# Patient Record
Sex: Female | Born: 1945
Health system: Southern US, Community
[De-identification: ages and names within clinical notes are randomized; demographics above are authoritative.]

## PROBLEM LIST (undated history)

## (undated) DIAGNOSIS — F32A Depression, unspecified: Secondary | ICD-10-CM

## (undated) DIAGNOSIS — R51 Headache: Secondary | ICD-10-CM

## (undated) DIAGNOSIS — F329 Major depressive disorder, single episode, unspecified: Secondary | ICD-10-CM

## (undated) DIAGNOSIS — K635 Polyp of colon: Secondary | ICD-10-CM

## (undated) DIAGNOSIS — R519 Headache, unspecified: Secondary | ICD-10-CM

## (undated) DIAGNOSIS — E119 Type 2 diabetes mellitus without complications: Secondary | ICD-10-CM

## (undated) DIAGNOSIS — I10 Essential (primary) hypertension: Secondary | ICD-10-CM

## (undated) DIAGNOSIS — E785 Hyperlipidemia, unspecified: Secondary | ICD-10-CM

## (undated) DIAGNOSIS — Z8619 Personal history of other infectious and parasitic diseases: Secondary | ICD-10-CM

## (undated) HISTORY — DX: Depression, unspecified: F32.A

## (undated) HISTORY — PX: TONSILLECTOMY: SHX5217

## (undated) HISTORY — DX: Hyperlipidemia, unspecified: E78.5

## (undated) HISTORY — DX: Polyp of colon: K63.5

## (undated) HISTORY — DX: Headache: R51

## (undated) HISTORY — DX: Type 2 diabetes mellitus without complications: E11.9

## (undated) HISTORY — DX: Headache, unspecified: R51.9

## (undated) HISTORY — DX: Personal history of other infectious and parasitic diseases: Z86.19

## (undated) HISTORY — DX: Essential (primary) hypertension: I10

## (undated) HISTORY — DX: Major depressive disorder, single episode, unspecified: F32.9

---

## 1974-02-19 HISTORY — PX: TUBAL LIGATION: SHX77

## 2012-10-02 LAB — HM COLONOSCOPY

## 2015-09-13 LAB — HM DEXA SCAN

## 2015-09-14 LAB — HM MAMMOGRAPHY

## 2016-01-20 ENCOUNTER — Ambulatory Visit: Payer: Self-pay | Admitting: Family

## 2016-02-03 ENCOUNTER — Telehealth: Payer: Self-pay | Admitting: Behavioral Health

## 2016-02-03 NOTE — Telephone Encounter (Signed)
Unable to reach patient at time of Pre-Visit Call.  Left message for patient to return call when available.    

## 2016-02-06 ENCOUNTER — Ambulatory Visit (INDEPENDENT_AMBULATORY_CARE_PROVIDER_SITE_OTHER): Payer: Medicare Other | Admitting: Family

## 2016-02-06 ENCOUNTER — Encounter: Payer: Self-pay | Admitting: Family

## 2016-02-06 VITALS — BP 161/85 | HR 85 | Temp 98.3°F | Resp 18 | Ht 67.0 in | Wt 160.6 lb

## 2016-02-06 DIAGNOSIS — E118 Type 2 diabetes mellitus with unspecified complications: Secondary | ICD-10-CM | POA: Diagnosis not present

## 2016-02-06 DIAGNOSIS — F418 Other specified anxiety disorders: Secondary | ICD-10-CM

## 2016-02-06 DIAGNOSIS — IMO0001 Reserved for inherently not codable concepts without codable children: Secondary | ICD-10-CM

## 2016-02-06 DIAGNOSIS — E1165 Type 2 diabetes mellitus with hyperglycemia: Secondary | ICD-10-CM | POA: Diagnosis not present

## 2016-02-06 DIAGNOSIS — I10 Essential (primary) hypertension: Secondary | ICD-10-CM

## 2016-02-06 DIAGNOSIS — E785 Hyperlipidemia, unspecified: Secondary | ICD-10-CM | POA: Diagnosis not present

## 2016-02-06 DIAGNOSIS — E113291 Type 2 diabetes mellitus with mild nonproliferative diabetic retinopathy without macular edema, right eye: Secondary | ICD-10-CM | POA: Insufficient documentation

## 2016-02-06 DIAGNOSIS — E119 Type 2 diabetes mellitus without complications: Secondary | ICD-10-CM | POA: Insufficient documentation

## 2016-02-06 HISTORY — DX: Essential (primary) hypertension: I10

## 2016-02-06 HISTORY — DX: Type 2 diabetes mellitus with mild nonproliferative diabetic retinopathy without macular edema, right eye: E11.3291

## 2016-02-06 HISTORY — DX: Other specified anxiety disorders: F41.8

## 2016-02-06 LAB — LIPID PANEL
CHOL/HDL RATIO: 4
Cholesterol: 214 mg/dL — ABNORMAL HIGH (ref 0–200)
HDL: 54.2 mg/dL (ref >39.00)
LDL Cholesterol: 125 mg/dL — ABNORMAL HIGH (ref 0–99)
NONHDL: 159.42
Triglycerides: 172 mg/dL — ABNORMAL HIGH (ref 0.0–149.0)
VLDL: 34.4 mg/dL (ref 0.0–40.0)

## 2016-02-06 LAB — MICROALBUMIN / CREATININE URINE RATIO
Creatinine,U: 71.7 mg/dL
MICROALB/CREAT RATIO: 1 mg/g (ref 0.0–30.0)
Microalb, Ur: <0.7 mg/dL (ref 0.0–1.9)

## 2016-02-06 LAB — BASIC METABOLIC PANEL
BUN: 20 mg/dL (ref 6–23)
CO2: 25 mEq/L (ref 19–32)
CREATININE: 0.75 mg/dL (ref 0.40–1.20)
Calcium: 9.8 mg/dL (ref 8.4–10.5)
Chloride: 103 mEq/L (ref 96–112)
GFR: 81.4 mL/min (ref >60.00)
Glucose, Bld: 261 mg/dL — ABNORMAL HIGH (ref 70–99)
Potassium: 4.3 mEq/L (ref 3.5–5.1)
Sodium: 137 mEq/L (ref 135–145)

## 2016-02-06 LAB — HEMOGLOBIN A1C: HEMOGLOBIN A1C: 12.4 % — AB (ref 4.6–6.5)

## 2016-02-06 MED ORDER — VENLAFAXINE HCL ER 37.5 MG PO CP24: ORAL_CAPSULE | ORAL | 0 refills | Status: DC

## 2016-02-06 NOTE — Progress Notes (Signed)
Subjective:    Patient ID: Wendy Guzman, female    DOB: Mar 31, 1945, 70 y.o.   MRN: [id number]  HPI  [name] is a 70 yr old female who presents today to establish care.  1) HTN- Pt is maintained on losartan BP Readings from Last 3 Encounters:  02/06/16 (!) 161/85   2) Hyperlipidemia- has hx of myalgia on statin.  She is maintained on zetia. Tries to watch her diet.   3) Depression/anxiety- maintained on prn xanax and daily citalopram. She reports that her mood is improved since Dr.  Leonette MostKalish increased her citalopram.  Reports prn use of xanax.    4) DM2- maintained on glipizide. She does not check her sugars at home.  She will meet with an endocrinologist.  She will establish with Dr. Allena KatzPatel.    Review of Systems  Constitutional: Negative for unexpected weight change.  HENT: Negative for hearing loss.        Reports recent URI with residual cough  Eyes: Negative for visual disturbance.  Respiratory: Negative for shortness of breath.   Cardiovascular: Negative for leg swelling.  Gastrointestinal: Negative for constipation and diarrhea.  Genitourinary: Negative for dysuria and frequency.  Musculoskeletal: Negative for arthralgias and myalgias.  Skin: Negative for rash.  Neurological:       Occasional headaches  Hematological: Negative for adenopathy.  Psychiatric/Behavioral:       See hpi      Past Medical History:  Diagnosis Date  . Colon polyps   . Depression   . Diabetes mellitus without complication (HCC)   . Frequent headaches   . History of chicken pox   . Hyperlipidemia   . Hypertension      Social History   Social History  . Marital status: Married    Spouse name: N/A  . Number of children: N/A  . Years of education: N/A   Occupational History  . Not on file.   Social History Main Topics  . Smoking status: Never Smoker  . Smokeless tobacco: Never Used  . Alcohol use 0.6 - 1.2 oz/week    1 - 2 Glasses of wine per week  . Drug use: No  .  Sexual activity: Not on file   Other Topics Concern  . Not on file   Social History Narrative  . No narrative on file    Past Surgical History:  Procedure Laterality Date  . TONSILLECTOMY  1950s  . TUBAL LIGATION  1976    Family History  Problem Relation Age of Onset  . COPD Mother   . Aneurysm Mother   . Cancer Father     lung  . Diabetes Father   . Diabetes Paternal Uncle   . Hypertension Maternal Grandmother     Allergies  Allergen Reactions  . Ezetimibe-Simvastatin Other (See Comments)    Extreme muscle pain and weakness  . Lovastatin Other (See Comments)    Extreme muscle pain and weakness  . Rosuvastatin Other (See Comments)    Extreme muscle pain and weakness    No current outpatient prescriptions on file prior to visit.   No current facility-administered medications on file prior to visit.     BP (!) 161/85 (BP Location: Right Arm, Cuff Size: Normal)   Pulse 85   Temp 98.3 F (36.8 C) (Oral)   Resp 18   Ht 5\' 7"  (1.702 m)   Wt 160 lb 9.6 oz (72.8 kg)   SpO2 98% Comment: room air  BMI 25.15 kg/m  Objective:   Physical Exam  Constitutional: She is oriented to person, place, and time. She appears well-developed and well-nourished.  HENT:  Head: Normocephalic and atraumatic.  Right Ear: Tympanic membrane and ear canal normal.  Left Ear: Tympanic membrane and ear canal normal.  Mouth/Throat: No oropharyngeal exudate, posterior oropharyngeal edema or posterior oropharyngeal erythema.  Cardiovascular: Normal rate, regular rhythm and normal heart sounds.   No murmur heard. Pulmonary/Chest: Effort normal and breath sounds normal. No respiratory distress. She has no wheezes.  Musculoskeletal: She exhibits no edema.  Neurological: She is alert and oriented to person, place, and time.  Psychiatric: Her behavior is normal. Judgment and thought content normal.  Tearful upon discussion of family stress          Assessment & Plan:

## 2016-02-06 NOTE — Assessment & Plan Note (Signed)
Depression is uncontrolled. Will add effexor.

## 2016-02-06 NOTE — Assessment & Plan Note (Signed)
Uncontrolled per patient.  She is advised to follow through with her endo appointment.  Obtain A1C, urine microalbumin.

## 2016-02-06 NOTE — Assessment & Plan Note (Signed)
Tolerating zetia, obtain lipid panel.

## 2016-02-06 NOTE — Patient Instructions (Signed)
Please complete lab work prior to leaving. Begin effexor 1 tab once daily for 3 days, then increase to 2 tabs once daily. Check blood pressure daily.  Call if BP >150/90 at home.

## 2016-02-06 NOTE — Assessment & Plan Note (Signed)
Notes that she has not taken her AM medication. Advised to take her med when she gets home today, check bp daily. Call if home bp >150/90.

## 2016-02-06 NOTE — Progress Notes (Signed)
Pre visit review using our clinic review tool, if applicable. No additional management support is needed unless otherwise documented below in the visit note. 

## 2016-02-10 ENCOUNTER — Telehealth: Payer: Self-pay | Admitting: Family

## 2016-02-10 MED ORDER — ASPIRIN EC 81 MG PO TBEC: 81.0000 mg | DELAYED_RELEASE_TABLET | Freq: Every day | ORAL | Status: DC

## 2016-02-10 NOTE — Telephone Encounter (Signed)
Sugar is very uncontrolled. Pt should keep her upcoming appointment with endocrinology.  Cholesterol is above goal. Needs to work hard on low fat/low cholesterol diet. Add aspirin 81mg  once daily for cardiac prevention.

## 2016-02-10 NOTE — Telephone Encounter (Signed)
Left message on machine to call back  

## 2016-02-15 NOTE — Telephone Encounter (Signed)
Left message on machine to call back on home and cell 

## 2016-02-17 NOTE — Telephone Encounter (Signed)
Spoke to the patient informed of results/instructions. She did have to reschedule her endo appt. Due to a family emergency, but has rescheduled and does understand the importance of being seen by Endo.  The patient did verbally understand/agreed to all result instructions.

## 2016-03-04 ENCOUNTER — Other Ambulatory Visit: Payer: Self-pay | Admitting: Family

## 2016-03-09 ENCOUNTER — Ambulatory Visit: Payer: Medicare Other | Admitting: Family

## 2016-03-14 ENCOUNTER — Encounter: Payer: Self-pay | Admitting: Family

## 2016-03-14 ENCOUNTER — Ambulatory Visit (INDEPENDENT_AMBULATORY_CARE_PROVIDER_SITE_OTHER): Payer: Medicare Other | Admitting: Family

## 2016-03-14 DIAGNOSIS — E118 Type 2 diabetes mellitus with unspecified complications: Secondary | ICD-10-CM

## 2016-03-14 DIAGNOSIS — F418 Other specified anxiety disorders: Secondary | ICD-10-CM

## 2016-03-14 DIAGNOSIS — I10 Essential (primary) hypertension: Secondary | ICD-10-CM | POA: Diagnosis not present

## 2016-03-14 NOTE — Progress Notes (Signed)
Pre visit review using our clinic review tool, if applicable. No additional management support is needed unless otherwise documented below in the visit note. 

## 2016-03-14 NOTE — Progress Notes (Signed)
Subjective:    Patient ID: Wendy Guzman, female    DOB: 1945/06/16, 71 y.o.   MRN: [id number]  HPI  Wendy Guzman is a 71 yr old female who presents today for follow up.  Depression. Last visit she noted that her depression was not well controlled and we added effexor to her regimen.  She reports that since we added effexor she is "more even keel."  Her grandchildren are living with her.  "Life is a little easier."  She is looking into establishing with a christian based counselor.   HTN- last visit she noted that she had not takedn her BP meds.   BP Readings from Last 3 Encounters:  03/14/16 110/70  02/06/16 (!) 161/85   DM2- she is scheduled to see Dr. Allena KatzPatel next week for endocrinology.  Lab Results  Component Value Date   HGBA1C 12.4 (H) 02/06/2016    Review of Systems    see HPI  Past Medical History:  Diagnosis Date  . Colon polyps   . Depression   . Diabetes mellitus without complication (HCC)   . Frequent headaches   . History of chicken pox   . Hyperlipidemia   . Hypertension      Social History   Social History  . Marital status: Married    Spouse name: N/A  . Number of children: N/A  . Years of education: N/A   Occupational History  . Not on file.   Social History Main Topics  . Smoking status: Never Smoker  . Smokeless tobacco: Never Used  . Alcohol use 0.6 - 1.2 oz/week    1 - 2 Glasses of wine per week  . Drug use: No  . Sexual activity: Not on file   Other Topics Concern  . Not on file   Social History Narrative   Retired Runner, broadcasting/film/videoteacher and ten worked at [employer]   Married   3 children, all are local ([place])   Enjoys reading, Clinical cytogeneticistcrafting, quilting/crocheting    Past Surgical History:  Procedure Laterality Date  . TONSILLECTOMY  1950s  . TUBAL LIGATION  1976    Family History  Problem Relation Age of Onset  . COPD Mother   . Aneurysm Mother   . Cancer Father     lung  . Diabetes Father   . Diabetes Paternal Uncle     . Hypertension Maternal Grandmother     Allergies  Allergen Reactions  . Ezetimibe-Simvastatin Other (See Comments)    Extreme muscle pain and weakness  . Lovastatin Other (See Comments)    Extreme muscle pain and weakness  . Rosuvastatin Other (See Comments)    Extreme muscle pain and weakness    Current Outpatient Prescriptions on File Prior to Visit  Medication Sig Dispense Refill  . ALPRAZolam (XANAX) 0.5 MG tablet Take 0.5 mg by mouth 3 (three) times daily as needed.    Marland Kitchen. aspirin EC 81 MG tablet Take 1 tablet (81 mg total) by mouth daily.    . citalopram (CELEXA) 40 MG tablet Take 40 mg by mouth daily.    Marland Kitchen. ezetimibe (ZETIA) 10 MG tablet Take 1 tablet by mouth daily.    . fluticasone (FLONASE) 50 MCG/ACT nasal spray Place 2 sprays into both nostrils daily.    Marland Kitchen. GLIPIZIDE PO Take 1 tablet by mouth daily.    Marland Kitchen. losartan (COZAAR) 50 MG tablet Take 1 tablet by mouth daily.    Marland Kitchen. venlafaxine XR (EFFEXOR-XR) 37.5 MG 24 hr capsule Take 2  tablets once a day. 60 capsule 0   No current facility-administered medications on file prior to visit.     BP 110/70 (BP Location: Left Arm, Cuff Size: Normal)   Pulse 83   Temp 97.9 F (36.6 C) (Oral)   Resp 16   Ht 5\' 7"  (1.702 m)   Wt 159 lb 3.2 oz (72.2 kg)   SpO2 100% Comment: room air  BMI 24.93 kg/m    Objective:   Physical Exam  Constitutional: She is oriented to person, place, and time. She appears well-developed and well-nourished.  HENT:  Head: Normocephalic and atraumatic.  Cardiovascular: Normal rate, regular rhythm and normal heart sounds.   No murmur heard. Pulmonary/Chest: Effort normal and breath sounds normal. No respiratory distress. She has no wheezes.  Musculoskeletal: She exhibits no edema.  Neurological: She is alert and oriented to person, place, and time.  Skin: Skin is warm and dry.  Psychiatric: She has a normal mood and affect. Her behavior is normal. Judgment and thought content normal.           Assessment & Plan:

## 2016-03-14 NOTE — Patient Instructions (Addendum)
Please continue effexor.  Consider scheduling an appointment with a counselor of your choice.

## 2016-03-14 NOTE — Assessment & Plan Note (Signed)
Uncontrolled. Reinforced importance of following through with her upcoming endocrinology consult.

## 2016-03-14 NOTE — Assessment & Plan Note (Signed)
Improved since effexor was added to her citalopram regimen. Continue same.

## 2016-03-14 NOTE — Assessment & Plan Note (Signed)
Improved this visit back on her meds. Reinforced importance of compliance and taking good care of herself.

## 2016-03-18 ENCOUNTER — Other Ambulatory Visit: Payer: Self-pay | Admitting: Family

## 2016-04-13 LAB — HM DIABETES EYE EXAM

## 2016-06-11 ENCOUNTER — Ambulatory Visit (INDEPENDENT_AMBULATORY_CARE_PROVIDER_SITE_OTHER): Payer: Medicare Other | Admitting: Family

## 2016-06-11 ENCOUNTER — Encounter: Payer: Self-pay | Admitting: Family

## 2016-06-11 VITALS — BP 123/76 | HR 80 | Temp 98.3°F | Resp 18 | Ht 67.0 in | Wt 169.0 lb

## 2016-06-11 DIAGNOSIS — E785 Hyperlipidemia, unspecified: Secondary | ICD-10-CM

## 2016-06-11 DIAGNOSIS — E119 Type 2 diabetes mellitus without complications: Secondary | ICD-10-CM

## 2016-06-11 DIAGNOSIS — I10 Essential (primary) hypertension: Secondary | ICD-10-CM

## 2016-06-11 DIAGNOSIS — Z1239 Encounter for other screening for malignant neoplasm of breast: Secondary | ICD-10-CM

## 2016-06-11 DIAGNOSIS — Z0000 Encounter for general adult medical examination without abnormal findings: Secondary | ICD-10-CM | POA: Diagnosis not present

## 2016-06-11 LAB — BASIC METABOLIC PANEL
BUN: 31 mg/dL — AB (ref 6–23)
CO2: 26 mEq/L (ref 19–32)
Calcium: 9.8 mg/dL (ref 8.4–10.5)
Chloride: 106 mEq/L (ref 96–112)
Creatinine, Ser: 0.85 mg/dL (ref 0.40–1.20)
GFR: 70.38 mL/min (ref >60.00)
Glucose, Bld: 180 mg/dL — ABNORMAL HIGH (ref 70–99)
POTASSIUM: 4.3 meq/L (ref 3.5–5.1)
SODIUM: 137 meq/L (ref 135–145)

## 2016-06-11 LAB — LIPID PANEL
Cholesterol: 221 mg/dL — ABNORMAL HIGH (ref 0–200)
HDL: 64.6 mg/dL (ref >39.00)
LDL Cholesterol: 145 mg/dL — ABNORMAL HIGH (ref 0–99)
NONHDL: 155.93
Total CHOL/HDL Ratio: 3
Triglycerides: 54 mg/dL (ref 0.0–149.0)
VLDL: 10.8 mg/dL (ref 0.0–40.0)

## 2016-06-11 LAB — MICROALBUMIN / CREATININE URINE RATIO
Creatinine,U: 115.9 mg/dL
MICROALB/CREAT RATIO: 1.2 mg/g (ref 0.0–30.0)
Microalb, Ur: 1.4 mg/dL (ref 0.0–1.9)

## 2016-06-11 LAB — HEMOGLOBIN A1C: HEMOGLOBIN A1C: 9.2 % — AB (ref 4.6–6.5)

## 2016-06-11 MED ORDER — ZOSTER VAC RECOMB ADJUVANTED 50 MCG/0.5ML IM SUSR: INTRAMUSCULAR | 1 refills | Status: DC

## 2016-06-11 NOTE — Progress Notes (Signed)
Subjective:    Patient ID: Wendy Guzman, female    DOB: 1945/08/06, 71 y.o.   MRN: [id number]  HPI  Patient presents today for complete physical.  Immunizations: prevnar, pneumovax up to date. Zoster 2012, declines tetanu Diet: reports that she is making good choices, trying to avoid sweets, white bread.  Exercise: housework/yardwork Colonoscopy: 8/14 Dexa: 2017 Pap Smear: N/A Mammogram: 7/17 Vision: 2/17 Wt Readings from Last 3 Encounters:  06/11/16 169 lb (76.7 kg)  03/14/16 159 lb 3.2 oz (72.2 kg)  02/06/16 160 lb 9.6 oz (72.8 kg)    DM2- Maintained on glipizide.  Reports that her she checks sugar intermittently.  Running in hte 200's.  Lab Results  Component Value Date   HGBA1C 12.4 (H) 02/06/2016   Lab Results  Component Value Date   MICROALBUR <0.7 02/06/2016   LDLCALC 125 (H) 02/06/2016   CREATININE 0.75 02/06/2016   HTN- maintained on losartan. BP Readings from Last 3 Encounters:  06/11/16 123/76  03/14/16 110/70  02/06/16 (!) 161/85   Hyperlipidemia- maintained on zetia. Reports debilitating muscle pain on multiple statins in the past.    Review of Systems  Constitutional: Negative for unexpected weight change.  HENT: Negative for rhinorrhea.   Eyes: Negative for visual disturbance.  Respiratory: Negative for cough.   Cardiovascular: Negative for leg swelling.  Gastrointestinal: Negative for constipation and diarrhea.  Genitourinary: Negative for dysuria and frequency.  Musculoskeletal: Negative for arthralgias and myalgias.  Neurological: Negative for headaches.  Hematological: Negative for adenopathy.  Psychiatric/Behavioral:       Reports depression, has had some stress, husband has prostate cancer, has living stress with daughter and her children living with her.    Past Medical History:  Diagnosis Date  . Colon polyps   . Depression   . Diabetes mellitus without complication (HCC)   . Frequent headaches   . History of chicken pox   .  Hyperlipidemia   . Hypertension      Social History   Social History  . Marital status: Married    Spouse name: N/A  . Number of children: N/A  . Years of education: N/A   Occupational History  . Not on file.   Social History Main Topics  . Smoking status: Never Smoker  . Smokeless tobacco: Never Used  . Alcohol use 1.2 - 2.4 oz/week    2 - 4 Glasses of wine per week  . Drug use: No  . Sexual activity: Not on file   Other Topics Concern  . Not on file   Social History Narrative   Retired Runner, broadcasting/film/video and ten worked at [employer]   Married   3 children, all are local ([place])   Enjoys reading, Clinical cytogeneticist, quilting/crocheting    Past Surgical History:  Procedure Laterality Date  . TONSILLECTOMY  1950s  . TUBAL LIGATION  1976    Family History  Problem Relation Age of Onset  . COPD Mother   . Aneurysm Mother   . Cancer Father     lung  . Diabetes Father   . Diabetes Paternal Uncle   . Hypertension Maternal Grandmother     Allergies  Allergen Reactions  . Ezetimibe-Simvastatin Other (See Comments)    Extreme muscle pain and weakness  . Lovastatin Other (See Comments)    Extreme muscle pain and weakness  . Rosuvastatin Other (See Comments)    Extreme muscle pain and weakness    Current Outpatient Prescriptions on File Prior to Visit  Medication  Sig Dispense Refill  . ALPRAZolam (XANAX) 0.5 MG tablet Take 0.5 mg by mouth 3 (three) times daily as needed.    Marland Kitchen aspirin EC 81 MG tablet Take 1 tablet (81 mg total) by mouth daily.    . Cholecalciferol (VITAMIN D3) 10000 units TABS Take 3 tablets by mouth daily.    . citalopram (CELEXA) 40 MG tablet Take 40 mg by mouth daily.    Marland Kitchen ezetimibe (ZETIA) 10 MG tablet Take 1 tablet by mouth daily.    . fluticasone (FLONASE) 50 MCG/ACT nasal spray Place 2 sprays into both nostrils daily.    Marland Kitchen losartan (COZAAR) 50 MG tablet Take 1 tablet by mouth daily.    Marland Kitchen venlafaxine XR (EFFEXOR-XR) 37.5 MG 24 hr capsule Take  2 capsules (75 mg total) by mouth daily. 60 capsule 5   No current facility-administered medications on file prior to visit.     BP 123/76 (BP Location: Right Arm, Cuff Size: Normal)   Pulse 80   Temp 98.3 F (36.8 C) (Oral)   Resp 18   Ht  (1.702 m)   Wt 169 lb (76.7 kg)   SpO2 98% Comment: room air  BMI 26.47 kg/m        Objective:   Physical Exam  Physical Exam  Constitutional: She is oriented to person, place, and time. She appears well-developed and well-nourished. No distress.  HENT:  Head: Normocephalic and atraumatic.  Right Ear: Tympanic membrane and ear canal normal.  Left Ear: Tympanic membrane and ear canal normal.  Mouth/Throat: Oropharynx is clear and moist.  Eyes: Pupils are equal, round, and reactive to light. No scleral icterus.  Neck: Normal range of motion. No thyromegaly present.  Cardiovascular: Normal rate and regular rhythm.   No murmur heard. Pulmonary/Chest: Effort normal and breath sounds normal. No respiratory distress. He has no wheezes. She has no rales. She exhibits no tenderness.  Abdominal: Soft. Bowel sounds are normal. She exhibits no distension and no mass. There is no tenderness. There is no rebound and no guarding.  Musculoskeletal: She exhibits no edema.  Lymphadenopathy:    She has no cervical adenopathy.  Neurological: She is alert and oriented to person, place, and time. She has normal patellar reflexes. She exhibits normal muscle tone. Coordination normal.  Skin: Skin is warm and dry.  Psychiatric: She has a normal mood and affect. Her behavior is normal. Judgment and thought content normal.  Breasts: Examined lying Right: Without masses, retractions, discharge or axillary adenopathy.  Left: Without masses, retractions, discharge or axillary adenopathy.  Pelvic: deferred.            Assessment & Plan:         Assessment & Plan:  Preventative Care- discussed diet, exercise, weight loss. Refer for mammogram. Rx  sent to [facility] for shingrix.    HTN- BP stable on current medications.  BP Readings from Last 3 Encounters:  06/11/16 123/76  03/14/16 110/70  02/06/16 (!) 161/85   Hyperlipidemia- intolerant ot statins. Obtain follow up lipid panel.  DM2- uncontrolled. Discussed eliminating wine which she has recently increased in setting of stress.  She has also had some weight gain and I suspect that this is contributing. She is advised to follow up with Dr. Allena Katz.

## 2016-06-11 NOTE — Patient Instructions (Addendum)
Restart your aspirin  once daily. Complete lab work prior to leaving. Try to add regular exercise such as walking 15 minutes a day. Work your way up to 30 minutes a day.  Keep your follow up with Dr. Allena Katz.  See if you can begin the Shingrix (shingles) vaccine at [facility].

## 2016-06-11 NOTE — Progress Notes (Signed)
Pre visit review using our clinic review tool, if applicable. No additional management support is needed unless otherwise documented below in the visit note. 

## 2016-06-12 ENCOUNTER — Telehealth: Payer: Self-pay | Admitting: *Deleted

## 2016-06-12 ENCOUNTER — Encounter: Payer: Self-pay | Admitting: Family

## 2016-06-12 DIAGNOSIS — M8580 Other specified disorders of bone density and structure, unspecified site: Secondary | ICD-10-CM | POA: Insufficient documentation

## 2016-06-12 NOTE — Telephone Encounter (Signed)
Result is available through care everywhere. Has "low bone mass". Is this osteoporosis?

## 2016-06-12 NOTE — Telephone Encounter (Signed)
-----   Message from Sandford Craze, NP sent at 06/11/2016  7:57 AM EDT ----- Can you please ask [facility] to send [place] a copy of her dexa from 2017? Thanks!

## 2016-09-10 ENCOUNTER — Telehealth: Payer: Self-pay | Admitting: Family

## 2016-09-10 NOTE — Telephone Encounter (Signed)
Relation to pt: self  Call back [phone]   Reason for call:  Patient was [facility] 06/11/16 [facility] physical appointment [facility] OUTPATIENT VISIT 25 MINUTES and she would like to know, please advise

## 2016-09-11 NOTE — Telephone Encounter (Signed)
Good morning,   Can you take a look at this one to make sure we coded correctly. DOS: 06/11/16 she was expecting a CPE   thanks

## 2016-09-17 NOTE — Telephone Encounter (Signed)
Hi Ebony, I double checked with Dawn on this as I said I would.  Documentation supports the additional charge.

## 2016-09-18 NOTE — Telephone Encounter (Signed)
You're welcome.  The patient's uncontrolled dm was addressed.

## 2016-09-18 NOTE — Telephone Encounter (Signed)
Called patient and let her know the bill was for her uncontrolled DM per coding. She is asking that we remove the 99214 from her visit from 06/11/16 because she sees a specialist for her DM and thought the conversation was just an overview of her [facility] and apart of her CPE

## 2016-09-18 NOTE — Telephone Encounter (Signed)
Thanks for getting back to me about this one. Can you tell the specific concern we addressed that attributed to the charge, so I can explain it to the patient? Thanks

## 2016-09-19 NOTE — Telephone Encounter (Signed)
I reviewed her chart and we reviewed her blood pressure, diet related to Diabetes, as well as cholesterol that visit. This was in addition to the standard work for a physical. She was due for follow up on these issues that day. If we did not do the same day as cpx we would have had to bring her back for separate OV. Even if we hadn't talked about DM, we would have had to charge OV for Htn and DM.

## 2016-12-10 ENCOUNTER — Encounter: Payer: Self-pay | Admitting: Family

## 2016-12-10 ENCOUNTER — Ambulatory Visit (INDEPENDENT_AMBULATORY_CARE_PROVIDER_SITE_OTHER): Payer: Medicare Other | Admitting: Family

## 2016-12-10 ENCOUNTER — Telehealth: Payer: Self-pay | Admitting: Family

## 2016-12-10 DIAGNOSIS — I10 Essential (primary) hypertension: Secondary | ICD-10-CM | POA: Diagnosis not present

## 2016-12-10 DIAGNOSIS — E118 Type 2 diabetes mellitus with unspecified complications: Secondary | ICD-10-CM

## 2016-12-10 DIAGNOSIS — F418 Other specified anxiety disorders: Secondary | ICD-10-CM

## 2016-12-10 DIAGNOSIS — Z23 Encounter for immunization: Secondary | ICD-10-CM | POA: Diagnosis not present

## 2016-12-10 DIAGNOSIS — E785 Hyperlipidemia, unspecified: Secondary | ICD-10-CM

## 2016-12-10 DIAGNOSIS — F419 Anxiety disorder, unspecified: Secondary | ICD-10-CM

## 2016-12-10 LAB — BASIC METABOLIC PANEL
BUN: 16 mg/dL (ref 6–23)
CALCIUM: 9.7 mg/dL (ref 8.4–10.5)
CHLORIDE: 102 meq/L (ref 96–112)
CO2: 27 meq/L (ref 19–32)
CREATININE: 0.82 mg/dL (ref 0.40–1.20)
GFR: 73.25 mL/min (ref >60.00)
GLUCOSE: 365 mg/dL — AB (ref 70–99)
Potassium: 4.5 mEq/L (ref 3.5–5.1)
Sodium: 136 mEq/L (ref 135–145)

## 2016-12-10 LAB — LIPID PANEL
CHOL/HDL RATIO: 3
Cholesterol: 227 mg/dL — ABNORMAL HIGH (ref 0–200)
HDL: 66.9 mg/dL (ref >39.00)
LDL CALC: 140 mg/dL — AB (ref 0–99)
NONHDL: 160.13
TRIGLYCERIDES: 103 mg/dL (ref 0.0–149.0)
VLDL: 20.6 mg/dL (ref 0.0–40.0)

## 2016-12-10 MED ORDER — VENLAFAXINE HCL ER 75 MG PO CP24: 75.0000 mg | ORAL_CAPSULE | Freq: Every day | ORAL | 5 refills | Status: DC

## 2016-12-10 MED ORDER — CITALOPRAM HYDROBROMIDE 40 MG PO TABS: 40.0000 mg | ORAL_TABLET | Freq: Every day | ORAL | 5 refills | Status: DC

## 2016-12-10 MED ORDER — ALPRAZOLAM 0.5 MG PO TABS: 0.5000 mg | ORAL_TABLET | Freq: Three times a day (TID) | ORAL | 0 refills | Status: DC | PRN

## 2016-12-10 MED ORDER — EZETIMIBE 10 MG PO TABS: 10.0000 mg | ORAL_TABLET | Freq: Every day | ORAL | 5 refills | Status: DC

## 2016-12-10 NOTE — Assessment & Plan Note (Signed)
Uncontrolled. She continues to work with Dr. Allena KatzPatel.

## 2016-12-10 NOTE — Assessment & Plan Note (Addendum)
Restart effexor at 75mg  once daily. Continue citalopram. Rx provided for prn xanax.  A controlled substance contract will be signed today and a UDS obtained.

## 2016-12-10 NOTE — Progress Notes (Signed)
Subjective:    Patient ID: Wendy Guzman, female    DOB: 1945/08/06, 71 y.o.   MRN: [id number]  HPI  Wendy Guzman is a 71 yr old female who presents today for follow up.  1) HTN- mainatined on losartan, Denies chest pain/sob or swelling.  BP Readings from Last 3 Encounters:  12/10/16 118/70  06/11/16 123/76  03/14/16 110/70   2) Hyperlipidemia- maintained on zetia.   Lab Results  Component Value Date   CHOL 221 (H) 06/11/2016   HDL 64.60 06/11/2016   LDLCALC 145 (H) 06/11/2016   TRIG 54.0 06/11/2016   CHOLHDL 3 06/11/2016   3) Depression/anxiety-  Maintained on citalopram, effexor and prn xanax. Lost her effexor a few weeks back. Feels more aggitated since she came off and "not in control of my emotions."  4) DM2- followed by Dr. Allena Katz. Last A1C was 8.5.    Review of Systems    see HPI  Past Medical History:  Diagnosis Date  . Colon polyps   . Depression   . Diabetes mellitus without complication (HCC)   . Frequent headaches   . History of chicken pox   . Hyperlipidemia   . Hypertension      Social History   Social History  . Marital status: Married    Spouse name: N/A  . Number of children: N/A  . Years of education: N/A   Occupational History  . Not on file.   Social History Main Topics  . Smoking status: Never Smoker  . Smokeless tobacco: Never Used  . Alcohol use 1.2 - 2.4 oz/week    2 - 4 Glasses of wine per week  . Drug use: No  . Sexual activity: Not on file   Other Topics Concern  . Not on file   Social History Narrative   Retired Runner, broadcasting/film/video and ten worked at [employer]   Married   3 children, all are local ([place])   Enjoys reading, Clinical cytogeneticist, quilting/crocheting    Past Surgical History:  Procedure Laterality Date  . TONSILLECTOMY  1950s  . TUBAL LIGATION  1976    Family History  Problem Relation Age of Onset  . COPD Mother   . Aneurysm Mother   . Cancer Father        lung  . Diabetes Father   . Diabetes  Paternal Uncle   . Hypertension Maternal Grandmother     Allergies  Allergen Reactions  . Ezetimibe-Simvastatin Other (See Comments)    Extreme muscle pain and weakness  . Lovastatin Other (See Comments)    Extreme muscle pain and weakness  . Rosuvastatin Other (See Comments)    Extreme muscle pain and weakness    Current Outpatient Prescriptions on File Prior to Visit  Medication Sig Dispense Refill  . aspirin EC 81 MG tablet Take 1 tablet (81 mg total) by mouth daily.    . Calcium Carbonate-Vitamin D (CALTRATE 600+D) 600-400 MG-UNIT tablet Take 1 tablet by mouth 2 (two) times daily.    . Cholecalciferol (VITAMIN D3) 10000 units TABS Take 3 tablets by mouth daily.    . fluticasone (FLONASE) 50 MCG/ACT nasal spray Place 2 sprays into both nostrils daily.    Marland Kitchen glipiZIDE (GLUCOTROL) 5 MG tablet Take 5 mg by mouth 2 (two) times daily before a meal.    . losartan (COZAAR) 50 MG tablet Take 1 tablet by mouth daily.    Marland Kitchen Zoster Vac Recomb Adjuvanted [facility]) injection One dose IM today, second dose in  2-6 months. 0.5 mL 1   No current facility-administered medications on file prior to visit.     BP 118/70   Pulse 98   Temp 98.5 F (36.9 C) (Oral)   Resp 16   Ht 5\' 7"  (1.702 m)   Wt 185 lb (83.9 kg)   SpO2 98%   BMI 28.98 kg/m    Objective:   Physical Exam  Constitutional: She appears well-developed and well-nourished.  Cardiovascular: Normal rate, regular rhythm and normal heart sounds.   No murmur heard. Pulmonary/Chest: Effort normal and breath sounds normal. No respiratory distress. She has no wheezes.  Psychiatric: She has a normal mood and affect. Her behavior is normal. Judgment and thought content normal.          Assessment & Plan:

## 2016-12-10 NOTE — Patient Instructions (Addendum)
Please restart effexor xr 75mg  once daily. Complete lab work prior to leaving.

## 2016-12-10 NOTE — Assessment & Plan Note (Signed)
Tolerating zetia, obtain follow up lipid panel.  

## 2016-12-10 NOTE — Telephone Encounter (Signed)
Please let pt know that her sugar is very uncontrolled. I would advise she arrange follow up with endocrinology.  Sugar was 365.  Also cholesterol remains above goal, please continue to work on low fat/low cholesterol diet, exercise and weight loss.

## 2016-12-10 NOTE — Assessment & Plan Note (Signed)
bp stable on current medication. Continue same/

## 2016-12-11 NOTE — Telephone Encounter (Signed)
Left message on home # to check mychart. Message sent.

## 2016-12-13 LAB — PAIN MGMT, PROFILE 8 W/CONF, U
6 ACETYLMORPHINE: NEGATIVE ng/mL (ref <10)
Alcohol Metabolites: POSITIVE ng/mL — AB (ref <500)
Amphetamines: NEGATIVE ng/mL (ref <500)
Benzodiazepines: NEGATIVE ng/mL (ref <100)
Buprenorphine, Urine: NEGATIVE ng/mL (ref <5)
Cocaine Metabolite: NEGATIVE ng/mL (ref <150)
Creatinine: 103.5 mg/dL
ETHYL GLUCURONIDE (ETG): 1835 ng/mL — AB (ref <500)
Ethyl Sulfate (ETS): 427 ng/mL — ABNORMAL HIGH (ref <100)
MDMA: NEGATIVE ng/mL (ref <500)
Marijuana Metabolite: NEGATIVE ng/mL (ref <20)
OPIATES: NEGATIVE ng/mL (ref <100)
OXYCODONE: NEGATIVE ng/mL (ref <100)
Oxidant: NEGATIVE ug/mL (ref <200)
pH: 5.95 (ref 4.5–9.0)

## 2016-12-14 ENCOUNTER — Encounter: Payer: Self-pay | Admitting: Family

## 2017-06-05 ENCOUNTER — Other Ambulatory Visit: Payer: Self-pay | Admitting: Family

## 2017-07-01 ENCOUNTER — Other Ambulatory Visit: Payer: Self-pay | Admitting: Family

## 2017-07-05 ENCOUNTER — Other Ambulatory Visit: Payer: Self-pay | Admitting: Family

## 2017-07-05 ENCOUNTER — Encounter: Payer: Self-pay | Admitting: Family

## 2017-08-06 ENCOUNTER — Other Ambulatory Visit: Payer: Self-pay | Admitting: Family

## 2017-09-28 ENCOUNTER — Other Ambulatory Visit: Payer: Self-pay | Admitting: Family

## 2017-10-24 ENCOUNTER — Other Ambulatory Visit: Payer: Self-pay | Admitting: Family

## 2017-10-25 MED ORDER — VENLAFAXINE HCL ER 75 MG PO CP24: 75.0000 mg | ORAL_CAPSULE | Freq: Every day | ORAL | 0 refills | Status: DC

## 2017-10-25 MED ORDER — EZETIMIBE 10 MG PO TABS: 10.0000 mg | ORAL_TABLET | Freq: Every day | ORAL | 0 refills | Status: DC

## 2017-10-25 MED ORDER — CITALOPRAM HYDROBROMIDE 40 MG PO TABS: 40.0000 mg | ORAL_TABLET | Freq: Every day | ORAL | 0 refills | Status: DC

## 2017-11-17 ENCOUNTER — Other Ambulatory Visit: Payer: Self-pay | Admitting: Family

## 2017-11-18 ENCOUNTER — Other Ambulatory Visit: Payer: Self-pay | Admitting: Family

## 2017-11-20 ENCOUNTER — Encounter: Payer: Self-pay | Admitting: Family

## 2017-11-20 ENCOUNTER — Ambulatory Visit (INDEPENDENT_AMBULATORY_CARE_PROVIDER_SITE_OTHER): Payer: Medicare Other | Admitting: Family

## 2017-11-20 VITALS — BP 133/68 | HR 90 | Temp 98.0°F | Resp 16 | Ht 66.5 in | Wt 182.2 lb

## 2017-11-20 DIAGNOSIS — E785 Hyperlipidemia, unspecified: Secondary | ICD-10-CM

## 2017-11-20 DIAGNOSIS — Z1239 Encounter for other screening for malignant neoplasm of breast: Secondary | ICD-10-CM

## 2017-11-20 DIAGNOSIS — F419 Anxiety disorder, unspecified: Secondary | ICD-10-CM

## 2017-11-20 DIAGNOSIS — M810 Age-related osteoporosis without current pathological fracture: Secondary | ICD-10-CM

## 2017-11-20 DIAGNOSIS — Z0000 Encounter for general adult medical examination without abnormal findings: Secondary | ICD-10-CM

## 2017-11-20 DIAGNOSIS — L309 Dermatitis, unspecified: Secondary | ICD-10-CM

## 2017-11-20 DIAGNOSIS — E118 Type 2 diabetes mellitus with unspecified complications: Secondary | ICD-10-CM

## 2017-11-20 DIAGNOSIS — Z23 Encounter for immunization: Secondary | ICD-10-CM | POA: Diagnosis not present

## 2017-11-20 LAB — COMPREHENSIVE METABOLIC PANEL
ALBUMIN: 4 g/dL (ref 3.5–5.2)
ALK PHOS: 83 U/L (ref 39–117)
ALT: 16 U/L (ref 0–35)
AST: 15 U/L (ref 0–37)
BILIRUBIN TOTAL: 0.5 mg/dL (ref 0.2–1.2)
BUN: 16 mg/dL (ref 6–23)
CALCIUM: 9.6 mg/dL (ref 8.4–10.5)
CO2: 27 mEq/L (ref 19–32)
Chloride: 103 mEq/L (ref 96–112)
Creatinine, Ser: 0.86 mg/dL (ref 0.40–1.20)
GFR: 69.15 mL/min (ref >60.00)
Glucose, Bld: 251 mg/dL — ABNORMAL HIGH (ref 70–99)
Potassium: 4.3 mEq/L (ref 3.5–5.1)
Sodium: 136 mEq/L (ref 135–145)
TOTAL PROTEIN: 6.3 g/dL (ref 6.0–8.3)

## 2017-11-20 LAB — LIPID PANEL
CHOLESTEROL: 171 mg/dL (ref 0–200)
HDL: 51.4 mg/dL (ref >39.00)
LDL Cholesterol: 91 mg/dL (ref 0–99)
NONHDL: 119.93
Total CHOL/HDL Ratio: 3
Triglycerides: 145 mg/dL (ref 0.0–149.0)
VLDL: 29 mg/dL (ref 0.0–40.0)

## 2017-11-20 MED ORDER — BETAMETHASONE DIPROPIONATE 0.05 % EX CREA: TOPICAL_CREAM | Freq: Two times a day (BID) | CUTANEOUS | 0 refills | Status: AC

## 2017-11-20 MED ORDER — ZOSTER VAC RECOMB ADJUVANTED 50 MCG/0.5ML IM SUSR: INTRAMUSCULAR | 1 refills | Status: DC

## 2017-11-20 NOTE — Progress Notes (Signed)
Subjective:    Patient ID: Wendy Guzman, female    DOB: 1945/07/22, 72 y.o.   MRN: [id number]  HPI  Patient presents today for complete physical.  Immunizations: Flu shot today.  Shingrix due. Pneumonia shots UTD Diet: trying to eat healthier.  Exercise:  Walking, gardening/mowing, housework  Colonoscopy: 2014- polyps Dexa: 2017 Pap Smear: 02/06/16 Mammogram:02/06/16 Dental:  Due.   Vision:  Due Wt Readings from Last 3 Encounters:  11/20/17 182 lb 3.2 oz (82.6 kg)  12/10/16 185 lb (83.9 kg)  06/11/16 169 lb (76.7 kg)   Anxiety- uses xanax infrequently.  Continues citalopram. Reports symptoms stable.  Review of Systems  Constitutional: Negative for unexpected weight change.  HENT: Positive for postnasal drip. Negative for hearing loss.   Eyes: Negative for visual disturbance.  Respiratory: Negative for cough.   Cardiovascular: Negative for leg swelling.  Gastrointestinal: Negative for blood in stool, constipation and diarrhea.  Genitourinary: Negative for dysuria, frequency and hematuria.  Musculoskeletal: Positive for arthralgias (shoulders).  Skin:       Notes skin rash/dry scaly on face  Neurological: Negative for headaches.  Hematological: Negative for adenopathy.  Psychiatric/Behavioral:       Denies depression/anxiety       Past Medical History:  Diagnosis Date  . Colon polyps   . Depression   . Diabetes mellitus without complication (HCC)   . Frequent headaches   . History of chicken pox   . Hyperlipidemia   . Hypertension      Social History   Socioeconomic History  . Marital status: Married    Spouse name: Not on file  . Number of children: Not on file  . Years of education: Not on file  . Highest education level: Not on file  Occupational History  . Not on file  Social Needs  . Financial resource strain: Not on file  . Food insecurity:    Worry: Not on file    Inability: Not on file  . Transportation needs:    Medical: Not on file      Non-medical: Not on file  Tobacco Use  . Smoking status: Never Smoker  . Smokeless tobacco: Never Used  Substance and Sexual Activity  . Alcohol use: Yes    Alcohol/week: 2.0 - 4.0 standard drinks    Types: 2 - 4 Glasses of wine per week  . Drug use: No  . Sexual activity: Not on file  Lifestyle  . Physical activity:    Days per week: Not on file    Minutes per session: Not on file  . Stress: Not on file  Relationships  . Social connections:    Talks on phone: Not on file    Gets together: Not on file    Attends religious service: Not on file    Active member of club or organization: Not on file    Attends meetings of clubs or organizations: Not on file    Relationship status: Not on file  . Intimate partner violence:    Fear of current or ex partner: Not on file    Emotionally abused: Not on file    Physically abused: Not on file    Forced sexual activity: Not on file  Other Topics Concern  . Not on file  Social History Narrative   Retired Runner, broadcasting/film/video and ten worked at [employer]   Married   3 children, all are local ([place])   Enjoys reading, Clinical cytogeneticist, quilting/crocheting    Past Surgical History:  Procedure Laterality Date  . TONSILLECTOMY  1950s  . TUBAL LIGATION  1976    Family History  Problem Relation Age of Onset  . COPD Mother   . Aneurysm Mother   . Cancer Father        lung  . Diabetes Father   . Diabetes Paternal Uncle   . Hypertension Maternal Grandmother     Allergies  Allergen Reactions  . Ezetimibe-Simvastatin Other (See Comments)    Extreme muscle pain and weakness  . Lovastatin Other (See Comments)    Extreme muscle pain and weakness  . Rosuvastatin Other (See Comments)    Extreme muscle pain and weakness    Current Outpatient Medications on File Prior to Visit  Medication Sig Dispense Refill  . ALPRAZolam (XANAX) 0.5 MG tablet Take 1 tablet (0.5 mg total) by mouth 3 (three) times daily as needed. 30 tablet 0  .  aspirin EC 81 MG tablet Take 1 tablet (81 mg total) by mouth daily.    . Calcium Carbonate-Vitamin D (CALTRATE 600+D) 600-400 MG-UNIT tablet Take 1 tablet by mouth 2 (two) times daily.    . Cholecalciferol (VITAMIN D3) 10000 units TABS Take 3 tablets by mouth daily.    . citalopram (CELEXA) 40 MG tablet Take 1 tablet (40 mg total) by mouth daily. 30 tablet 0  . ezetimibe (ZETIA) 10 MG tablet Take 1 tablet (10 mg total) by mouth daily. 30 tablet 0  . fluticasone (FLONASE) 50 MCG/ACT nasal spray Place 2 sprays into both nostrils daily.    Marland Kitchen glipiZIDE (GLUCOTROL) 5 MG tablet TAKE 1&1/2 TABLETS TWICE A DAY BEFORE MEALS    . losartan (COZAAR) 50 MG tablet Take 1 tablet by mouth daily.    Marland Kitchen venlafaxine XR (EFFEXOR-XR) 75 MG 24 hr capsule Take 1 capsule (75 mg total) by mouth daily with breakfast. 30 capsule 0   No current facility-administered medications on file prior to visit.     BP 133/68 (BP Location: Left Arm, Patient Position: Sitting, Cuff Size: Large)   Pulse 90   Temp 98 F (36.7 C) (Oral)   Resp 16   Ht 5' 6.5" (1.689 m)   Wt 182 lb 3.2 oz (82.6 kg)   SpO2 98%   BMI 28.97 kg/m    Objective:   Physical Exam  Physical Exam  Constitutional: She is oriented to person, place, and time. She appears well-developed and well-nourished. No distress.  HENT:  Head: Normocephalic and atraumatic.  Right Ear: Tympanic membrane and ear canal normal.  Left Ear: Tympanic membrane and ear canal normal.  Mouth/Throat: Oropharynx is clear and moist.  Eyes: Pupils are equal, round, and reactive to light. No scleral icterus.  Neck: Normal range of motion. No thyromegaly present.  Cardiovascular: Normal rate and regular rhythm.   No murmur heard. Pulmonary/Chest: Effort normal and breath sounds normal. No respiratory distress. He has no wheezes. She has no rales. She exhibits no tenderness.  Abdominal: Soft. Bowel sounds are normal. She exhibits no distension and no mass. There is no tenderness.  There is no rebound and no guarding.  Musculoskeletal: She exhibits no edema.  Lymphadenopathy:    She has no cervical adenopathy.  Neurological: She is alert and oriented to person, place, and time. She has normal patellar reflexes. She exhibits normal muscle tone. Coordination normal.  Skin: Skin is warm and dry. eczema right pinna. And some dry patches noted on face.  Psychiatric: She has a normal mood and affect. Her behavior is normal.  Judgment and thought content normal.  Breasts: Examined lying Right: Without masses, retractions, discharge or axillary adenopathy.  Left: Without masses, retractions, discharge or axillary adenopathy.  Pelvic: deferred         Assessment & Plan:   Preventative care- refer for labs, rx sent for Shingrix.  Repeat mammo, dexa, colo. Flu shot today.   Eczema- rx with betamethasone, pt advised to avoid eye area.   Anxiety-stable on citalopram with rare use of xanax.  UDS today.  Contract will be updated.       Assessment & Plan:

## 2017-11-20 NOTE — Patient Instructions (Addendum)
Please schedule routine eye exam and dental.   You may apply betamethasone cream twice daily as needed. Avoid exposure to your eye area. Complete lab work prior to leaving. You should be contacted about your referral to GI.

## 2017-11-21 LAB — PAIN MGMT, PROFILE 8 W/CONF, U
6 ACETYLMORPHINE: NEGATIVE ng/mL (ref <10)
AMPHETAMINES: NEGATIVE ng/mL (ref <500)
Alcohol Metabolites: NEGATIVE ng/mL (ref <500)
Benzodiazepines: NEGATIVE ng/mL (ref <100)
Buprenorphine, Urine: NEGATIVE ng/mL (ref <5)
Cocaine Metabolite: NEGATIVE ng/mL (ref <150)
Creatinine: 76.8 mg/dL
MARIJUANA METABOLITE: NEGATIVE ng/mL (ref <20)
MDMA: NEGATIVE ng/mL (ref <500)
OPIATES: NEGATIVE ng/mL (ref <100)
OXIDANT: NEGATIVE ug/mL (ref <200)
OXYCODONE: NEGATIVE ng/mL (ref <100)
pH: 6.16 (ref 4.5–9.0)

## 2017-11-22 ENCOUNTER — Encounter: Payer: Self-pay | Admitting: Family

## 2017-12-11 ENCOUNTER — Other Ambulatory Visit: Payer: Self-pay | Admitting: Family

## 2017-12-12 ENCOUNTER — Other Ambulatory Visit: Payer: Self-pay | Admitting: Family

## 2018-01-14 ENCOUNTER — Ambulatory Visit (HOSPITAL_BASED_OUTPATIENT_CLINIC_OR_DEPARTMENT_OTHER)
Admission: RE | Admit: 2018-01-14 | Discharge: 2018-01-14 | Disposition: A | Discharge status: Home / Self Care | Payer: Medicare Other | Source: Ambulatory Visit | Attending: Family | Admitting: Family

## 2018-01-14 ENCOUNTER — Encounter (HOSPITAL_BASED_OUTPATIENT_CLINIC_OR_DEPARTMENT_OTHER): Payer: Self-pay

## 2018-01-14 ENCOUNTER — Encounter: Payer: Self-pay | Admitting: Family

## 2018-01-14 DIAGNOSIS — Z1239 Encounter for other screening for malignant neoplasm of breast: Secondary | ICD-10-CM | POA: Diagnosis present

## 2018-01-14 DIAGNOSIS — M810 Age-related osteoporosis without current pathological fracture: Secondary | ICD-10-CM | POA: Diagnosis not present

## 2018-01-23 ENCOUNTER — Encounter: Payer: Self-pay | Admitting: Family

## 2018-05-01 LAB — HM DIABETES EYE EXAM

## 2018-05-23 ENCOUNTER — Ambulatory Visit (INDEPENDENT_AMBULATORY_CARE_PROVIDER_SITE_OTHER): Payer: Medicare Other | Admitting: Family

## 2018-05-23 ENCOUNTER — Other Ambulatory Visit: Payer: Self-pay

## 2018-05-23 DIAGNOSIS — I10 Essential (primary) hypertension: Secondary | ICD-10-CM | POA: Diagnosis not present

## 2018-05-23 DIAGNOSIS — F418 Other specified anxiety disorders: Secondary | ICD-10-CM | POA: Diagnosis not present

## 2018-05-23 DIAGNOSIS — E785 Hyperlipidemia, unspecified: Secondary | ICD-10-CM | POA: Diagnosis not present

## 2018-05-23 DIAGNOSIS — E119 Type 2 diabetes mellitus without complications: Secondary | ICD-10-CM

## 2018-05-23 MED ORDER — CITALOPRAM HYDROBROMIDE 40 MG PO TABS: 40.0000 mg | ORAL_TABLET | Freq: Every day | ORAL | 1 refills | Status: DC

## 2018-05-23 MED ORDER — EZETIMIBE 10 MG PO TABS: 10.0000 mg | ORAL_TABLET | Freq: Every day | ORAL | 1 refills | Status: DC

## 2018-05-23 MED ORDER — VENLAFAXINE HCL ER 75 MG PO CP24: 75.0000 mg | ORAL_CAPSULE | Freq: Every day | ORAL | 1 refills | Status: DC

## 2018-05-23 MED ORDER — LOSARTAN POTASSIUM 50 MG PO TABS: 50.0000 mg | ORAL_TABLET | Freq: Every day | ORAL | 1 refills | Status: DC

## 2018-05-23 MED ORDER — ALPRAZOLAM 0.5 MG PO TABS: 0.5000 mg | ORAL_TABLET | Freq: Three times a day (TID) | ORAL | 0 refills | Status: DC | PRN

## 2018-05-23 MED ORDER — FLUTICASONE PROPIONATE 50 MCG/ACT NA SUSP: 2.0000 | Freq: Every day | NASAL | 5 refills | Status: DC

## 2018-05-23 NOTE — Addendum Note (Signed)
Addended by: Sandford Craze on: 05/23/2018 04:35 PM   Modules accepted: Orders

## 2018-05-23 NOTE — Progress Notes (Signed)
Virtual Visit via Video Note  I connected with Ms. Hennessee on 05/23/18 at  7:40 AM EDT by a video enabled telemedicine application and verified that I am speaking with the correct person using two identifiers.   I discussed the limitations of evaluation and management by telemedicine and the availability of in person appointments. The patient expressed understanding and agreed to proceed.  Only the patient and myself were on today's video visit. The patient was at home and I was in my office at the time of today's visit.   History of Present Illness:  HTN- maintained on losartan. Continues losartan.  151/96 today.  154/96 BP Readings from Last 3 Encounters:  11/20/17 133/68  12/10/16 118/70  06/11/16 123/76   DM2- maintained on glucotrol. Not checking her sugar.  Trying to watch her diet.  She last saw endo in 9/19. A1C was 11 at that time. Jardiace was added to her regimen. She had to cancel a recent appointment due to covid 19.   Lab Results  Component Value Date   HGBA1C 9.2 (H) 06/11/2016   HGBA1C 12.4 (H) 02/06/2016   Lab Results  Component Value Date   MICROALBUR 1.4 06/11/2016   LDLCALC 91 11/20/2017   CREATININE 0.86 11/20/2017   Anxiety- reports that she would like a refill on xanax.   Continues effexor. She reports mild depression symptoms but manageable.   Seasonal allergies-  Reports + post nasal drip. Ran out of flonase.    Observations/Objective:   Gen: Awake, alert, no acute distress Resp: Breathing is even and non-labored Psych: calm/pleasant demeanor Neuro: Alert and Oriented x 3, + facial symmetry, speech is clear.   Assessment and Plan:  1) DM2- uncontrolled. Discussed importance of exercise, and checking sugars regularly.  2) HTN- bp is up today. I have asked the patient to check bp once daily for 1 week then send me her readings via mychart.  Continue current dose of losartan for now.  3) Hyperlipidemia- LDL at goal.  Continue zetia.  4) Allergic  rhinitis- uncontrolled, restart flonase, add claritin once daily.  5) depression/anxiety- fair control. Continue effexor/citalopram and prn xanax.    Due to covid 19 I have advised pt that I would like to hold off on bringing her into the lab at this time, however I will place orders for future lab draw and I have asked her to call to schedule a lab appointment in about 1 month when covid outbreak is hopefully improved.  Pt verbalizes understanding. Follow Up Instructions:    I discussed the assessment and treatment plan with the patient. The patient was provided an opportunity to ask questions and all were answered. The patient agreed with the plan and demonstrated an understanding of the instructions.   The patient was advised to call back or seek an in-person evaluation if the symptoms worsen or if the condition fails to improve as anticipated.    Lemont Fillers, NP

## 2018-10-10 ENCOUNTER — Other Ambulatory Visit: Payer: Self-pay

## 2018-10-10 NOTE — Patient Outreach (Signed)
[facility]) Care Management  10/10/2018  Mayda Shippee [birth year] [id number]   Medication Adherence call to Mrs. [place] Compliant Voice message left with a call back number. Mrs. Guzman is showing past due on Losartan 25 mg under [facility].   [facility] Management Direct Dial [phone]  Fax [phone] [email or web address]

## 2018-11-17 ENCOUNTER — Other Ambulatory Visit: Payer: Self-pay | Admitting: Family

## 2018-11-18 NOTE — Telephone Encounter (Signed)
I sent refills but she is due for follow up. I would like to see her in person so we can check labs and blood pressure.

## 2018-11-26 ENCOUNTER — Telehealth: Payer: Self-pay | Admitting: Family

## 2018-11-26 NOTE — Telephone Encounter (Signed)
Called pt left msg to call back for follow up  

## 2018-11-26 NOTE — Telephone Encounter (Signed)
Please contact pt to schedule a follow up appointment as she is past due.

## 2018-12-19 ENCOUNTER — Other Ambulatory Visit: Payer: Self-pay

## 2018-12-19 ENCOUNTER — Ambulatory Visit: Payer: Medicare Other | Admitting: Family

## 2018-12-19 VITALS — BP 145/86 | HR 89 | Temp 96.6°F | Resp 16 | Ht 66.0 in | Wt 175.0 lb

## 2018-12-19 DIAGNOSIS — E785 Hyperlipidemia, unspecified: Secondary | ICD-10-CM | POA: Diagnosis not present

## 2018-12-19 DIAGNOSIS — E119 Type 2 diabetes mellitus without complications: Secondary | ICD-10-CM

## 2018-12-19 DIAGNOSIS — F418 Other specified anxiety disorders: Secondary | ICD-10-CM

## 2018-12-19 DIAGNOSIS — I10 Essential (primary) hypertension: Secondary | ICD-10-CM

## 2018-12-19 LAB — COMPREHENSIVE METABOLIC PANEL
ALT: 20 U/L (ref 0–35)
AST: 17 U/L (ref 0–37)
Albumin: 4.4 g/dL (ref 3.5–5.2)
Alkaline Phosphatase: 114 U/L (ref 39–117)
BUN: 20 mg/dL (ref 6–23)
CO2: 28 mEq/L (ref 19–32)
Calcium: 10.2 mg/dL (ref 8.4–10.5)
Chloride: 102 mEq/L (ref 96–112)
Creatinine, Ser: 0.95 mg/dL (ref 0.40–1.20)
GFR: 57.82 mL/min — ABNORMAL LOW (ref >60.00)
Glucose, Bld: 380 mg/dL — ABNORMAL HIGH (ref 70–99)
Potassium: 5.1 mEq/L (ref 3.5–5.1)
Sodium: 138 mEq/L (ref 135–145)
Total Bilirubin: 0.7 mg/dL (ref 0.2–1.2)
Total Protein: 6.7 g/dL (ref 6.0–8.3)

## 2018-12-19 LAB — LIPID PANEL
Cholesterol: 221 mg/dL — ABNORMAL HIGH (ref 0–200)
HDL: 59.9 mg/dL (ref >39.00)
LDL Cholesterol: 143 mg/dL — ABNORMAL HIGH (ref 0–99)
NonHDL: 160.83
Total CHOL/HDL Ratio: 4
Triglycerides: 88 mg/dL (ref 0.0–149.0)
VLDL: 17.6 mg/dL (ref 0.0–40.0)

## 2018-12-19 LAB — HEMOGLOBIN A1C: Hgb A1c MFr Bld: 11.8 % — ABNORMAL HIGH (ref 4.6–6.5)

## 2018-12-19 MED ORDER — VENLAFAXINE HCL ER 37.5 MG PO CP24: ORAL_CAPSULE | ORAL | 0 refills | Status: DC

## 2018-12-19 MED ORDER — LOSARTAN POTASSIUM 50 MG PO TABS: 50.0000 mg | ORAL_TABLET | Freq: Every day | ORAL | 1 refills | Status: DC

## 2018-12-19 MED ORDER — CITALOPRAM HYDROBROMIDE 10 MG PO TABS: ORAL_TABLET | ORAL | 0 refills | Status: DC

## 2018-12-19 MED ORDER — EZETIMIBE 10 MG PO TABS: 10.0000 mg | ORAL_TABLET | Freq: Every day | ORAL | 1 refills | Status: DC

## 2018-12-19 MED ORDER — ALPRAZOLAM 0.5 MG PO TABS: 0.5000 mg | ORAL_TABLET | Freq: Three times a day (TID) | ORAL | 0 refills | Status: DC | PRN

## 2018-12-19 MED ORDER — DULOXETINE HCL 30 MG PO CPEP: ORAL_CAPSULE | ORAL | 1 refills | Status: DC

## 2018-12-19 NOTE — Patient Instructions (Signed)
Decrease effexor to 37.5mg  once daily for 2 weeks then stop. Decrease citalopram to 20mg  (two 10mg  tabs) once daily for 2 weeks, then 1 tablet once daily for 1 week then stop. On the beginning of week three- Start cymbalta 30mg  once daily for one week, then increase to 2 tabs daily the second week.

## 2018-12-19 NOTE — Progress Notes (Signed)
Subjective:    Patient ID: Wendy Guzman, female    DOB: 1945/02/28, 73 y.o.   MRN: [id number]  HPI   Patient is a 73 yr old female who presents today for follow up.  DM2- maintained on jardiance and glucotrol.    Lab Results  Component Value Date   HGBA1C 9.2 (H) 06/11/2016   HGBA1C 12.4 (H) 02/06/2016   Lab Results  Component Value Date   MICROALBUR 1.4 06/11/2016   LDLCALC 91 11/20/2017   CREATININE 0.86 11/20/2017   HTN- Maintained on losartan 25mg .   BP Readings from Last 3 Encounters:  12/19/18 (!) 145/86  11/20/17 133/68  12/10/16 118/70   Hyperlipidemia- maintained on zetia. Intolerant to statins.  Lab Results  Component Value Date   CHOL 171 11/20/2017   HDL 51.40 11/20/2017   LDLCALC 91 11/20/2017   TRIG 145.0 11/20/2017   CHOLHDL 3 11/20/2017   Depression/anxiety- reports that she rarely uses xanax.  She is requesting a refill of Xanax to have on hand.  She reports fair control of her anxiety and depression.  She notes some stress at home, in particular with her daughter.    Review of Systems    see HPI  Past Medical History:  Diagnosis Date  . Colon polyps   . Depression   . Diabetes mellitus without complication (HCC)   . Frequent headaches   . History of chicken pox   . Hyperlipidemia   . Hypertension      Social History   Socioeconomic History  . Marital status: Married    Spouse name: Not on file  . Number of children: Not on file  . Years of education: Not on file  . Highest education level: Not on file  Occupational History  . Not on file  Social Needs  . Financial resource strain: Not on file  . Food insecurity    Worry: Not on file    Inability: Not on file  . Transportation needs    Medical: Not on file    Non-medical: Not on file  Tobacco Use  . Smoking status: Never Smoker  . Smokeless tobacco: Never Used  Substance and Sexual Activity  . Alcohol use: Yes    Alcohol/week: 2.0 - 4.0 standard drinks    Types: 2  - 4 Glasses of wine per week  . Drug use: No  . Sexual activity: Not on file  Lifestyle  . Physical activity    Days per week: Not on file    Minutes per session: Not on file  . Stress: Not on file  Relationships  . Social 01/20/2018 on phone: Not on file    Gets together: Not on file    Attends religious service: Not on file    Active member of club or organization: Not on file    Attends meetings of clubs or organizations: Not on file    Relationship status: Not on file  . Intimate partner violence    Fear of current or ex partner: Not on file    Emotionally abused: Not on file    Physically abused: Not on file    Forced sexual activity: Not on file  Other Topics Concern  . 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 TONSILLECTOMY  1950s  . TUBAL LIGATION  1976    Family History  Problem Relation Age of Onset  . COPD Mother   . Aneurysm Mother   . Cancer Father        lung  . Diabetes Father   . Diabetes Paternal Uncle   . Hypertension Maternal Grandmother     Allergies  Allergen Reactions  . Ezetimibe-Simvastatin Other (See Comments)    Extreme muscle pain and weakness  . Lovastatin Other (See Comments)    Extreme muscle pain and weakness  . Rosuvastatin Other (See Comments)    Extreme muscle pain and weakness    Current Outpatient Medications on File Prior to Visit  Medication Sig Dispense Refill  . ALPRAZolam (XANAX) 0.5 MG tablet Take 1 tablet (0.5 mg total) by mouth 3 (three) times daily as needed. 30 tablet 0  . aspirin EC 81 MG tablet Take 1 tablet (81 mg total) by mouth daily.    . betamethasone dipropionate (DIPROLENE) 0.05 % cream Apply topically 2 (two) times daily. 30 g 0  . Calcium Carbonate-Vitamin D (CALTRATE 600+D) 600-400  MG-UNIT tablet Take 1 tablet by mouth 2 (two) times daily.    . Cholecalciferol (VITAMIN D3) 10000 units TABS Take 3 tablets by mouth daily.    Marland Kitchen. ezetimibe (ZETIA) 10 MG tablet TAKE 1 TABLET BY MOUTH EVERY DAY 90 tablet 0  . fluticasone (FLONASE) 50 MCG/ACT nasal spray SPRAY 2 SPRAYS INTO EACH NOSTRIL EVERY DAY 48 mL 1  . glipiZIDE (GLUCOTROL) 5 MG tablet TAKE 1&1/2 TABLETS TWICE A DAY BEFORE MEALS    . JARDIANCE 25 MG TABS tablet Take 25 mg by mouth daily.    Marland Kitchen. losartan (COZAAR) 50 MG tablet Take 1 tablet (50 mg total) by mouth daily. 90 tablet 1  . Zoster Vaccine Adjuvanted [facility](SHINGRIX) injection One dose IM today, second dose in 2-6 months. 0.5 mL 1   No current facility-administered medications on file prior to visit.     BP (!) 145/86 (BP Location: Right Arm, Patient Position: Sitting, Cuff Size: Small)   Pulse 89   Temp (!) 96.6 F (35.9 C) (Temporal)   Resp 16   Ht 5\' 6"  (1.676 m)   Wt 175 lb (79.4 kg)   SpO2 97%   BMI 28.25 kg/m    Objective:   Physical Exam Constitutional:      Appearance: She is well-developed.  Neck:     Musculoskeletal: Neck supple.     Thyroid: No thyromegaly.  Cardiovascular:     Rate and Rhythm: Normal rate and regular rhythm.     Heart sounds: Normal heart sounds. No murmur.  Pulmonary:     Effort: Pulmonary effort is normal. No respiratory distress.     Breath sounds: Normal breath sounds. No wheezing.  Skin:    General: Skin is warm and dry.  Neurological:     Mental Status: She is alert and oriented to person, place, and time.  Psychiatric:        Behavior: Behavior normal.        Thought Content: Thought content normal.        Judgment: Judgment normal.           Assessment & Plan:  Depression/anxiety-she is currently maintained on citalopram and Effexor.  I would like to try to wean her off of these medications and transition her to Cymbalta.  I am hopeful that she will have a slightly better response from a depression  standpoint with this change as well.  Plan taper as outlined in AVS.  She is advised to contact me if she develops any mood issues with this transition.  Hypertension-fair blood pressure control on losartan.  Will continue same.  Diabetes type 2-control is unknown.  Historically has been uncontrolled.  Will obtain A1c.  She has not seen her endocrinologist in nearly a year.  Hyperlipidemia-tolerating Zetia.  Due for follow-up lipid panel.  Intolerant to statins.

## 2018-12-20 LAB — PAIN MGMT, PROFILE 8 W/CONF, U
6 Acetylmorphine: NEGATIVE ng/mL
Alcohol Metabolites: NEGATIVE ng/mL (ref <500)
Amphetamines: NEGATIVE ng/mL
Benzodiazepines: NEGATIVE ng/mL
Buprenorphine, Urine: NEGATIVE ng/mL
Cocaine Metabolite: NEGATIVE ng/mL
Creatinine: 43 mg/dL
MDMA: NEGATIVE ng/mL
Marijuana Metabolite: NEGATIVE ng/mL
Opiates: NEGATIVE ng/mL
Oxidant: NEGATIVE ug/mL
Oxycodone: NEGATIVE ng/mL
pH: 5.4 (ref 4.5–9.0)

## 2018-12-22 ENCOUNTER — Telehealth: Payer: Self-pay | Admitting: Family

## 2018-12-22 NOTE — Telephone Encounter (Signed)
Please advise pt that her sugar is very uncontrolled. I would like for her to schedule a follow up appointment with Dr. Posey Pronto (endocrinology) and work on diabetic diet, exercise and weight loss.  Lab Results  Component Value Date   HGBA1C 11.8 (H) 12/19/2018

## 2018-12-22 NOTE — Telephone Encounter (Signed)
Patient advised about sugar levels and to call Dr. Posey Pronto for an appointment today. She verbalized understanding.

## 2019-01-05 LAB — HM MAMMOGRAPHY

## 2019-01-07 ENCOUNTER — Other Ambulatory Visit: Payer: Self-pay | Admitting: Obstetrics and Gynecology

## 2019-01-07 DIAGNOSIS — R928 Other abnormal and inconclusive findings on diagnostic imaging of breast: Secondary | ICD-10-CM

## 2019-01-10 ENCOUNTER — Other Ambulatory Visit: Payer: Self-pay | Admitting: Family

## 2019-01-12 NOTE — Telephone Encounter (Signed)
My records show that she should be off of effexor and off of citalopram and only on cymbalta.  I received a refill request for citalopram which I declined.  Is she still taking it?  How is she feeling?

## 2019-01-14 ENCOUNTER — Telehealth: Payer: Self-pay | Admitting: Family

## 2019-01-14 NOTE — Telephone Encounter (Signed)
Lvm for patient to call back about her results 

## 2019-01-14 NOTE — Telephone Encounter (Signed)
Please contact pt and let her know that I reviewed the mammogram results from Dr. Kennith Maes office.  Please let pt know that the radiologist would like her to complete some additional breast images for further evaluation. Let me know if she has not been contacted by them about a follow up appointment in 1 week.

## 2019-01-19 ENCOUNTER — Ambulatory Visit
Admission: RE | Admit: 2019-01-19 | Discharge: 2019-01-19 | Disposition: A | Discharge status: Home / Self Care | Payer: Medicare Other | Source: Ambulatory Visit | Attending: Obstetrics and Gynecology | Admitting: Obstetrics and Gynecology

## 2019-01-19 ENCOUNTER — Other Ambulatory Visit: Payer: Self-pay

## 2019-01-19 DIAGNOSIS — R928 Other abnormal and inconclusive findings on diagnostic imaging of breast: Secondary | ICD-10-CM

## 2019-01-19 NOTE — Telephone Encounter (Signed)
Patient does not answer the telephone, her appointments information indicate she is scheduled for a breast ultra sound today.

## 2019-01-21 ENCOUNTER — Other Ambulatory Visit: Payer: Self-pay | Admitting: Family

## 2019-01-21 NOTE — Telephone Encounter (Signed)
Called patient again today to verify her medications

## 2019-01-22 NOTE — Telephone Encounter (Signed)
I received another refill request for her citalopram today. Per our last visit she should be off of this medication. Can you reach out to patient to see how things are going with her medications?  She should have tapered off of citalopram.

## 2019-01-27 ENCOUNTER — Encounter: Payer: Medicare Other | Admitting: Family

## 2019-01-27 NOTE — Telephone Encounter (Signed)
Call patient but no answer/ lvm for patient to call back for medication clarification.

## 2019-02-02 NOTE — Telephone Encounter (Signed)
Lvm for patient to call back and verify medications.

## 2019-02-10 ENCOUNTER — Other Ambulatory Visit: Payer: Self-pay | Admitting: Family

## 2019-03-03 ENCOUNTER — Ambulatory Visit (INDEPENDENT_AMBULATORY_CARE_PROVIDER_SITE_OTHER): Payer: Medicare PPO | Admitting: Family

## 2019-03-03 ENCOUNTER — Other Ambulatory Visit: Payer: Self-pay

## 2019-03-03 ENCOUNTER — Encounter: Payer: Self-pay | Admitting: Family

## 2019-03-03 VITALS — BP 133/86 | HR 88 | Temp 98.0°F | Resp 16 | Ht 66.0 in | Wt 174.8 lb

## 2019-03-03 DIAGNOSIS — E119 Type 2 diabetes mellitus without complications: Secondary | ICD-10-CM

## 2019-03-03 DIAGNOSIS — F329 Major depressive disorder, single episode, unspecified: Secondary | ICD-10-CM | POA: Diagnosis not present

## 2019-03-03 DIAGNOSIS — F32A Depression, unspecified: Secondary | ICD-10-CM

## 2019-03-03 DIAGNOSIS — I10 Essential (primary) hypertension: Secondary | ICD-10-CM

## 2019-03-03 DIAGNOSIS — Z0001 Encounter for general adult medical examination with abnormal findings: Secondary | ICD-10-CM | POA: Diagnosis not present

## 2019-03-03 DIAGNOSIS — Z1159 Encounter for screening for other viral diseases: Secondary | ICD-10-CM

## 2019-03-03 DIAGNOSIS — Z0000 Encounter for general adult medical examination without abnormal findings: Secondary | ICD-10-CM

## 2019-03-03 MED ORDER — TETANUS-DIPHTH-ACELL PERTUSSIS 5-2.5-18.5 LF-MCG/0.5 IM SUSP: 0.5000 mL | Freq: Once | INTRAMUSCULAR | 0 refills | Status: AC

## 2019-03-03 MED ORDER — ESCITALOPRAM OXALATE 10 MG PO TABS: ORAL_TABLET | ORAL | 0 refills | Status: DC

## 2019-03-03 NOTE — Progress Notes (Signed)
Subjective:    Patient ID: Wendy Guzman, female    DOB: 1945/07/30 74 y.o.   MRN: [id number]  HPI  Patient is a 74 yr old female who presents today for a complete physical.  Patient presents today for complete physical.  Immunizations:  Flu up to date, Due for shingrix #2 Diet: diet is healthier- trying to stay away from sugar, eating less bagels/desserts Wt Readings from Last 3 Encounters:  03/03/19 174 lb 12.8 oz (79.3 kg)  12/19/18 175 lb (79.4 kg)  11/20/17 182 lb 3.2 oz (82.6 kg)  Exercise: no formal exercise Colonoscopy: due 2024 Dexa: 2019 Pap Smear: N/A Mammogram:  01/19/19  Reports that cymbalta does not handle her emotions as well as the citalopram/effexor combo.  Last visit we weaned her off of citalopram and effexor and placed her on cymbalta.    DM2- states that she needs to make appointment with Dr. Posey Pronto (endo)  Review of Systems  Constitutional: Negative for unexpected weight change.  HENT: Negative for hearing loss (mild hearing loss, declines audiology) and rhinorrhea.   Eyes: Negative for visual disturbance.  Respiratory: Negative for cough and shortness of breath.   Cardiovascular: Negative for chest pain.  Gastrointestinal: Negative for constipation and diarrhea.  Genitourinary: Negative for dysuria and frequency.  Musculoskeletal: Negative for arthralgias and myalgias.  Skin: Negative for color change and rash.  Neurological: Negative for headaches.  Hematological: Negative for adenopathy.  Psychiatric/Behavioral:       See HPI    Past Medical History:  Diagnosis Date  . Colon polyps   . Depression   . Diabetes mellitus without complication ([place])   . Frequent headaches   . History of chicken pox   . Hyperlipidemia   . Hypertension      Social History   Socioeconomic History  . Marital status: Married    Spouse name: Not on file  . Number of children: Not on file  . Years of education: Not on file  . Highest education  level: Not on file  Occupational History  . Not on file  Tobacco Use  . Smoking status: Never Smoker  . Smokeless tobacco: Never Used  Substance and Sexual Activity  . Alcohol use: Yes    Alcohol/week: 2.0 - 4.0 standard drinks    Types: 2 - 4 Glasses of wine per week  . Drug use: No  . Sexual activity: Not on file  Other Topics Concern  . Not on file  Social History Narrative   Retired Pharmacist, hospital and ten worked at the [employer]   Married   3 children, all are local ([place])   Enjoys reading, Barrister's clerk, quilting/crocheting   Social Determinants of Radio broadcast assistant Strain:   . Difficulty of Paying Living Expenses: Not on file  Food Insecurity:   . Worried About Charity fundraiser in the Last Year: Not on file  . Ran Out of Food in the Last Year: Not on file  Transportation Needs:   . Lack of Transportation (Medical): Not on file  . Lack of Transportation (Non-Medical): Not on file  Physical Activity:   . Days of Exercise per Week: Not on file  . Minutes of Exercise per Session: Not on file  Stress:   . Feeling of Stress : Not on file  Social Connections:   . Frequency of Communication with Friends and Family: Not on file  . Frequency of Social Gatherings with Friends and Family: Not on file  .  Attends Religious Services: Not on file  . Active Member of Clubs or Organizations: Not on file  . Attends Banker Meetings: Not on file  . Marital Status: Not on file  Intimate Partner Violence:   . Fear of Current or Ex-Partner: Not on file  . Emotionally Abused: Not on file  . Physically Abused: Not on file  . Sexually Abused: Not on file    Past Surgical History:  Procedure Laterality Date  . TONSILLECTOMY  1950s  . TUBAL LIGATION  1976    Family History  Problem Relation Age of Onset  . COPD Mother   . Aneurysm Mother   . Cancer Father        lung  . Diabetes Father   . Diabetes Paternal Uncle   . Hypertension Maternal  Grandmother     Allergies  Allergen Reactions  . Ezetimibe-Simvastatin Other (See Comments)    Extreme muscle pain and weakness  . Lovastatin Other (See Comments)    Extreme muscle pain and weakness  . Rosuvastatin Other (See Comments)    Extreme muscle pain and weakness    Current Outpatient Medications on File Prior to Visit  Medication Sig Dispense Refill  . ALPRAZolam (XANAX) 0.5 MG tablet Take 1 tablet (0.5 mg total) by mouth 3 (three) times daily as needed. 30 tablet 0  . aspirin EC 81 MG tablet Take 1 tablet (81 mg total) by mouth daily.    . betamethasone dipropionate (DIPROLENE) 0.05 % cream Apply topically 2 (two) times daily. 30 g 0  . Calcium Carbonate-Vitamin D (CALTRATE 600+D) 600-400 MG-UNIT tablet Take 1 tablet by mouth 2 (two) times daily.    . Cholecalciferol (VITAMIN D3) 10000 units TABS Take 3 tablets by mouth daily.    . DULoxetine (CYMBALTA) 30 MG capsule TAKE 2 CAPSULES BY MOUTH EVERY DAY 180 capsule 1  . ezetimibe (ZETIA) 10 MG tablet Take 1 tablet (10 mg total) by mouth daily. 90 tablet 1  . fluticasone (FLONASE) 50 MCG/ACT nasal spray SPRAY 2 SPRAYS INTO EACH NOSTRIL EVERY DAY 48 mL 1  . glipiZIDE (GLUCOTROL) 5 MG tablet TAKE 1&1/2 TABLETS TWICE A DAY BEFORE MEALS    . JARDIANCE 25 MG TABS tablet Take 25 mg by mouth daily.    Marland Kitchen losartan (COZAAR) 50 MG tablet Take 1 tablet (50 mg total) by mouth daily. 90 tablet 1  . Zoster Vaccine Adjuvanted [facility]) injection One dose IM today, second dose in 2-6 months. 0.5 mL 1   No current facility-administered medications on file prior to visit.    BP 133/86 (BP Location: Right Arm, Patient Position: Sitting, Cuff Size: Small)   Pulse 88   Temp 98 F (36.7 C) (Temporal)   Resp 16   Ht 5\' 6"  (1.676 m)   Wt 174 lb 12.8 oz (79.3 kg)   SpO2 98%   BMI 28.21 kg/m        Objective:   Physical Exam  Physical Exam  Constitutional: She is oriented to person, place, and time. She appears well-developed and  well-nourished. No distress.  HENT:  Head: Normocephalic and atraumatic.  Right Ear: Tympanic membrane and ear canal normal.  Left Ear: Tympanic membrane and ear canal normal.  Mouth/Throat: not examined- pt wearing mask Eyes: Pupils are equal, round, and reactive to light. No scleral icterus.  Neck: Normal range of motion. No thyromegaly present.  Cardiovascular: Normal rate and regular rhythm.   No murmur heard. Pulmonary/Chest: Effort normal and breath sounds normal.  No respiratory distress. He has no wheezes. She has no rales. She exhibits no tenderness.  Abdominal: Soft. Bowel sounds are normal. She exhibits no distension and no mass. There is no tenderness. There is no rebound and no guarding.  Musculoskeletal: She exhibits no edema.  Lymphadenopathy:    She has no cervical adenopathy.  Neurological: She is alert and oriented to person, place, and time. She has normal patellar reflexes. She exhibits normal muscle tone. Coordination normal.  Skin: Skin is warm and dry.  Psychiatric: She has a normal mood and affect. Her behavior is normal. Judgment and thought content normal.  Breast/pelvic deferred         Assessment & Plan:   Preventative care- discussed healthy diet and regular exercise. Lab work reviewed and up to date. Colo/mammo up to date.  DM2- she will schedule follow up with Endo.  Depression- uncontrolled.  Advised pt as follows:  Decrease cymbalta to 30mg  once daily for 2 weeks the stop. Start lexapro 10mg , 1/2 tab once daily for 1 week, then increase to a full tab once daily on week two.  HTN- maintained on losartan, bp stable, continue same.    Assessment & Plan:

## 2019-03-03 NOTE — Patient Instructions (Addendum)
Decrease cymbalta to 30mg  once daily for 2 weeks the stop. Start lexapro 10mg , 1/2 tab once daily for 1 week, then increase to a full tab once daily on week two.

## 2019-03-09 ENCOUNTER — Encounter: Payer: Self-pay | Admitting: Family

## 2019-03-13 ENCOUNTER — Other Ambulatory Visit: Payer: Self-pay | Admitting: Family

## 2019-03-13 MED ORDER — ALPRAZOLAM 0.5 MG PO TABS: 0.5000 mg | ORAL_TABLET | Freq: Three times a day (TID) | ORAL | 0 refills | Status: DC | PRN

## 2019-03-27 ENCOUNTER — Other Ambulatory Visit: Payer: Self-pay | Admitting: Family

## 2019-03-31 ENCOUNTER — Ambulatory Visit (INDEPENDENT_AMBULATORY_CARE_PROVIDER_SITE_OTHER): Payer: Medicare PPO | Admitting: Family

## 2019-03-31 ENCOUNTER — Encounter: Payer: Self-pay | Admitting: Family

## 2019-03-31 DIAGNOSIS — F329 Major depressive disorder, single episode, unspecified: Secondary | ICD-10-CM | POA: Diagnosis not present

## 2019-03-31 DIAGNOSIS — F419 Anxiety disorder, unspecified: Secondary | ICD-10-CM | POA: Diagnosis not present

## 2019-03-31 MED ORDER — ESCITALOPRAM OXALATE 20 MG PO TABS: 20.0000 mg | ORAL_TABLET | Freq: Every day | ORAL | 1 refills | Status: DC

## 2019-03-31 NOTE — Patient Instructions (Signed)
Below are two ways to schedule a Covid-19 Vaccine:  Please visit [email or web address] to register or call [phone]  Or call:  [place] Covid-19 vaccine scheduling at [phone]  

## 2019-03-31 NOTE — Progress Notes (Signed)
Virtual Visit via Video Note  I connected with Wendy Guzman on 03/31/19 at  9:40 AM EST by a video enabled telemedicine application and verified that I am speaking with the correct person using two identifiers.  Location: Patient: home Provider: work   I discussed the limitations of evaluation and management by telemedicine and the availability of in person appointments. The patient expressed understanding and agreed to proceed.  History of Present Illness:  Patient is a 74 yr old female who presents today for follow up of her depression.  Last we made the following changes:   Decrease cymbalta to 30mg  once daily for 2 weeks the stop. Start lexapro 10mg , 1/2 tab once daily for 1 week, then increase to a full tab once daily on week two.  She reports that she is anxiety "most of the time." Mood is OK. Reports that she is sleeping well. Denies side effects. Denies thought of hurting self/others.  Past Medical History:  Diagnosis Date  . Colon polyps   . Depression   . Diabetes mellitus without complication (H[facility])   . Frequent headaches   . History of chicken pox   . Hyperlipidemia   . Hypertension      Social History   Socioeconomic History  . Marital status: Married    Spouse name: Not on file  . Number of children: Not on file  . Years of education: Not on file  . Highest education level: Not on file  Occupational History  . Not on file  Tobacco Use  . Smoking status: Never Smoker  . Smokeless tobacco: Never Used  Substance and Sexual Activity  . Alcohol use: Yes    Alcohol/week: 2.0 - 4.0 standard drinks    Types: 2 - 4 Glasses of wine per week  . Drug use: No  . Sexual activity: Not on file  Other Topics Concern  . Not on file  Social History Narrative   Retired and ten worked at the   Married   3 children, all are local ([place])   Enjoys reading, Runner, broadcasting/film/video, quilting/crocheting   Social Determinants of [employer] Strain:   . Difficulty of Paying Living Expenses: Not on file  Food Insecurity:   . Worried About Clinical cytogeneticist in the Last Year: Not on file  . Ran Out of Food in the Last Year: Not on file  Transportation Needs:   . Lack of Transportation (Medical): Not on file  . Lack of Transportation (Non-Medical): Not on file  Physical Activity:   . Days of Exercise per Week: Not on file  . Minutes of Exercise per Session: Not on file  Stress:   . Feeling of Stress : Not on file  Social Connections:   . Frequency of Communication with Friends and Family: Not on file  . Frequency of Social Gatherings with Friends and Family: Not on file  . Attends Religious Services: Not on file  . Active Member of Clubs or Organizations: Not on file  . Attends Manufacturing engineer Meetings: Not on file  . Marital Status: Not on file  Intimate Partner Violence:   . Fear of Current or Ex-Partner: Not on file  . Emotionally Abused: Not on file  . Physically Abused: Not on file  . Sexually Abused: Not on file    Past Surgical History:  Procedure Laterality Date  . TONSILLECTOMY  1950s  . TUBAL LIGATION  1976    Family History  Problem Relation Age of Onset  . COPD Mother   . Aneurysm Mother        AAA  . Cancer Father        lung  . Diabetes Father   . Diabetes Paternal Uncle   . Hypertension Maternal Grandmother   . Atrial fibrillation Brother   . Diabetes Mellitus II Brother   . Atrial fibrillation Brother   . Diabetes Mellitus II Brother     Allergies  Allergen Reactions  . Ezetimibe-Simvastatin Other (See Comments)    Extreme muscle pain and weakness  . Lovastatin Other (See Comments)    Extreme muscle pain and weakness  . Rosuvastatin Other (See Comments)    Extreme muscle pain and weakness    Current Outpatient Medications on File Prior to Visit  Medication Sig Dispense Refill  . ALPRAZolam (XANAX) 0.5 MG tablet Take 1 tablet (0.5 mg total) by mouth 3  (three) times daily as needed. 30 tablet 0  . aspirin EC 81 MG tablet Take 1 tablet (81 mg total) by mouth daily.    . betamethasone dipropionate (DIPROLENE) 0.05 % cream Apply topically 2 (two) times daily. 30 g 0  . Calcium Carbonate-Vitamin D (CALTRATE 600+D) 600-400 MG-UNIT tablet Take 1 tablet by mouth 2 (two) times daily.    . Cholecalciferol (VITAMIN D3) 10000 units TABS Take 3 tablets by mouth daily.    . DULoxetine (CYMBALTA) 30 MG capsule TAKE 2 CAPSULES BY MOUTH EVERY DAY 180 capsule 1  . escitalopram (LEXAPRO) 10 MG tablet TAKE 1/2 TAB ONCE DAILY FOR 1 WEEK, THEN INCREASE TO A FULL TAB ONCE DAILY ON WEEK TWO 90 tablet 1  . ezetimibe (ZETIA) 10 MG tablet Take 1 tablet (10 mg total) by mouth daily. 90 tablet 1  . fluticasone (FLONASE) 50 MCG/ACT nasal spray SPRAY 2 SPRAYS INTO EACH NOSTRIL EVERY DAY 48 mL 1  . glipiZIDE (GLUCOTROL) 5 MG tablet TAKE 1&1/2 TABLETS TWICE A DAY BEFORE MEALS    . JARDIANCE 25 MG TABS tablet Take 25 mg by mouth daily.    Marland Kitchen losartan (COZAAR) 50 MG tablet Take 1 tablet (50 mg total) by mouth daily. 90 tablet 1  . Zoster Vaccine Adjuvanted [facility]) injection One dose IM today, second dose in 2-6 months. 0.5 mL 1   No current facility-administered medications on file prior to visit.    There were no vitals taken for this visit.     Observations/Objective:   Gen: Awake, alert, no acute distress Resp: Breathing is even and non-labored Psych: calm/pleasant demeanor Neuro: Alert and Oriented x 3, + facial symmetry, speech is clear.   Assessment and Plan:  Anxiety/depression- uncontrolled. Will increase lexapro from 10mg  to 20mg  once daily. Plan follow up in 6 weeks.   20 minutes spent on today's visit.     Follow Up Instructions:    I discussed the assessment and treatment plan with the patient. The patient was provided an opportunity to ask questions and all were answered. The patient agreed with the plan and demonstrated an understanding of  the instructions.   The patient was advised to call back or seek an in-person evaluation if the symptoms worsen or if the condition fails to improve as anticipated.  [name], NP

## 2019-04-24 ENCOUNTER — Other Ambulatory Visit: Payer: Self-pay | Admitting: Family

## 2019-05-11 ENCOUNTER — Other Ambulatory Visit: Payer: Self-pay | Admitting: Family

## 2019-07-29 ENCOUNTER — Other Ambulatory Visit: Payer: Self-pay | Admitting: Family

## 2019-07-31 ENCOUNTER — Other Ambulatory Visit: Payer: Self-pay

## 2019-07-31 ENCOUNTER — Ambulatory Visit: Payer: Medicare PPO | Admitting: Family

## 2019-07-31 ENCOUNTER — Encounter: Payer: Self-pay | Admitting: Family

## 2019-07-31 VITALS — BP 134/80 | HR 95 | Temp 97.3°F | Resp 16 | Ht 66.0 in | Wt 182.0 lb

## 2019-07-31 DIAGNOSIS — F419 Anxiety disorder, unspecified: Secondary | ICD-10-CM

## 2019-07-31 DIAGNOSIS — F418 Other specified anxiety disorders: Secondary | ICD-10-CM

## 2019-07-31 DIAGNOSIS — I10 Essential (primary) hypertension: Secondary | ICD-10-CM

## 2019-07-31 DIAGNOSIS — E119 Type 2 diabetes mellitus without complications: Secondary | ICD-10-CM

## 2019-07-31 DIAGNOSIS — E785 Hyperlipidemia, unspecified: Secondary | ICD-10-CM

## 2019-07-31 DIAGNOSIS — L989 Disorder of the skin and subcutaneous tissue, unspecified: Secondary | ICD-10-CM

## 2019-07-31 MED ORDER — LOSARTAN POTASSIUM 50 MG PO TABS: 50.0000 mg | ORAL_TABLET | Freq: Every day | ORAL | 1 refills | Status: DC

## 2019-07-31 MED ORDER — TETANUS-DIPHTHERIA TOXOIDS TD 2-2 LF/0.5ML IM SUSP: 0.5000 mL | Freq: Once | INTRAMUSCULAR | 0 refills | Status: AC

## 2019-07-31 MED ORDER — TRESIBA 100 UNIT/ML ~~LOC~~ SOLN: 20.0000 [IU] | Freq: Every day | SUBCUTANEOUS | Status: DC

## 2019-07-31 MED ORDER — ALPRAZOLAM 0.5 MG PO TABS: 0.5000 mg | ORAL_TABLET | Freq: Three times a day (TID) | ORAL | 0 refills | Status: DC | PRN

## 2019-07-31 NOTE — Patient Instructions (Addendum)
Please schedule a diabetic eye exam.  Add claritin or zyrtec once daily as needed for allergies.

## 2019-07-31 NOTE — Progress Notes (Signed)
Subjective:    Patient ID: Wendy Guzman, female    DOB: [birth year], 74 y.o.   MRN: [id number]  HPI  Patient is a 74 yr old female who presents today for follow up.  Anxiety/depression- reports that "I have been managing."  She is using xanax once every 2 weeks.    Lab Results  Component Value Date   CHOL 221 (H) 12/19/2018   HDL 59.90 12/19/2018   LDLCALC 143 (H) 12/19/2018   TRIG 88.0 12/19/2018   CHOLHDL 4 12/19/2018   C/o post nasal drip at night with cough. Tried delsym and mucinex.  Sputum production has improved.    DM2- She is now following with Dr. Allena Katz at [facility]. He started her on tresiba insulin as well as jardiance.  Glipizide was discontinued. Reports A1C was 14.1. Reports that her sugar has been running in the 300's. Reports that her diet has been "not bad." But eating more fast foods. Trying to stay away from sugar.   Lab Results  Component Value Date   HGBA1C 11.8 (H) 12/19/2018   HGBA1C 9.2 (H) 06/11/2016   HGBA1C 12.4 (H) 02/06/2016   Lab Results  Component Value Date   MICROALBUR 1.4 06/11/2016   LDLCALC 143 (H) 12/19/2018   CREATININE 0.95 12/19/2018   Skin lesion- she reports a new skin lesion on left breast x 2-3 weeks.     Review of Systems    see HPI  Past Medical History:  Diagnosis Date  . Colon polyps   . Depression   . Diabetes mellitus without complication ([facility])   . Frequent headaches   . History of chicken pox   . Hyperlipidemia   . Hypertension      Social History   Socioeconomic History  . Marital status: Married    Spouse name: Not on file  . Number of children: Not on file  . Years of education: Not on file  . Highest education level: Not on file  Occupational History  . Not on file  Tobacco Use  . Smoking status: Never Smoker  . Smokeless tobacco: Never Used  Substance and Sexual Activity  . Alcohol use: Yes    Alcohol/week: 2.0 - 4.0 standard drinks    Types: 2 - 4 Glasses of wine per week  . Drug  use: No  . Sexual activity: Not on file  Other Topics Concern  . Not on file  Social History Narrative   Retired Runner, broadcasting/film/video and ten worked at the [employer]   Married   3 children, all are Designer, multimedia ([place])   Enjoys reading, Clinical cytogeneticist, quilting/crocheting   Social Determinants of Corporate investment banker Strain:   . Difficulty of Paying Living Expenses:   Food Insecurity:   . Worried About Programme researcher, broadcasting/film/video in the Last Year:   . Barista in the Last Year:   Transportation Needs:   . Freight forwarder (Medical):   Marland Kitchen Lack of Transportation (Non-Medical):   Physical Activity:   . Days of Exercise per Week:   . Minutes of Exercise per Session:   Stress:   . Feeling of Stress :   Social Connections:   . Frequency of Communication with Friends and Family:   . Frequency of Social Gatherings with Friends and Family:   . Attends Religious Services:   . Active Member of Clubs or Organizations:   . Attends Banker Meetings:   Marland Kitchen Marital Status:   Intimate Partner Violence:   .  Fear of Current or Ex-Partner:   . Emotionally Abused:   Marland Kitchen Physically Abused:   . Sexually Abused:     Past Surgical History:  Procedure Laterality Date  . TONSILLECTOMY  1950s  . TUBAL LIGATION  1976    Family History  Problem Relation Age of Onset  . COPD Mother   . Aneurysm Mother        AAA  . Cancer Father        lung  . Diabetes Father   . Diabetes Paternal Uncle   . Hypertension Maternal Grandmother   . Atrial fibrillation Brother   . Diabetes Mellitus II Brother   . Atrial fibrillation Brother   . Diabetes Mellitus II Brother     Allergies  Allergen Reactions  . Ezetimibe-Simvastatin Other (See Comments)    Extreme muscle pain and weakness  . Lovastatin Other (See Comments)    Extreme muscle pain and weakness  . Rosuvastatin Other (See Comments)    Extreme muscle pain and weakness    Current Outpatient Medications on File Prior to Visit   Medication Sig Dispense Refill  . ALPRAZolam (XANAX) 0.5 MG tablet Take 1 tablet (0.5 mg total) by mouth 3 (three) times daily as needed. 30 tablet 0  . aspirin EC 81 MG tablet Take 1 tablet (81 mg total) by mouth daily.    . betamethasone dipropionate (DIPROLENE) 0.05 % cream Apply topically 2 (two) times daily. 30 g 0  . Calcium Carbonate-Vitamin D (CALTRATE 600+D) 600-400 MG-UNIT tablet Take 1 tablet by mouth 2 (two) times daily.    . Cholecalciferol (VITAMIN D3) 10000 units TABS Take 3 tablets by mouth daily.    Marland Kitchen escitalopram (LEXAPRO) 20 MG tablet TAKE 1 TABLET BY MOUTH EVERY DAY 90 tablet 1  . ezetimibe (ZETIA) 10 MG tablet TAKE 1 TABLET BY MOUTH EVERY DAY 90 tablet 1  . fluticasone (FLONASE) 50 MCG/ACT nasal spray SPRAY 2 SPRAYS INTO EACH NOSTRIL EVERY DAY 48 mL 1  . glipiZIDE (GLUCOTROL) 5 MG tablet TAKE 1&1/2 TABLETS TWICE A DAY BEFORE MEALS    . JARDIANCE 25 MG TABS tablet Take 25 mg by mouth daily.    Marland Kitchen losartan (COZAAR) 50 MG tablet Take 1 tablet (50 mg total) by mouth daily. 90 tablet 1  . Zoster Vaccine Adjuvanted [facility]) injection One dose IM today, second dose in 2-6 months. 0.5 mL 1   No current facility-administered medications on file prior to visit.    There were no vitals taken for this visit.   Objective:   Physical Exam Constitutional:      Appearance: She is well-developed.  Cardiovascular:     Rate and Rhythm: Normal rate and regular rhythm.     Heart sounds: Normal heart sounds. No murmur heard.   Pulmonary:     Effort: Pulmonary effort is normal. No respiratory distress.     Breath sounds: Normal breath sounds. No wheezing.  Psychiatric:        Behavior: Behavior normal.        Thought Content: Thought content normal.        Judgment: Judgment normal.   skin: small raised, dry appearing lesion noted beneath the left nipple.  Diabetic Foot Exam - Simple   Simple Foot Form Diabetic Foot exam was performed with the following findings: Yes  07/31/2019  9:54 AM  Visual Inspection No deformities, no ulcerations, no other skin breakdown bilaterally: Yes Sensation Testing Intact to touch and monofilament testing bilaterally: Yes Pulse Check Posterior  Tibialis and Dorsalis pulse intact bilaterally: Yes Comments        Assessment & Plan:  DM2- very uncontrolled. Discussed the importance of diabetic diet. Defer management to Endocrinology. She will have CMET drawn today with endo.   Hyperlipidemia- uncontrolled- she will have lipid panel drawn today with endo.  Depression/Anxiety- stable on current regimen. Continue xanax and lexapro. UDS today and contract update.  Skin lesion- refer to dermatology.   Allergic rhinitis- advised pt to try adding an antihistamine once daily such as claritin or zyrtec.    This visit occurred during the SARS-CoV-2 public health emergency.  Safety protocols were in place, including screening questions prior to the visit, additional usage of staff PPE, and extensive cleaning of exam room while observing appropriate contact time as indicated for disinfecting solutions.

## 2019-08-01 LAB — DRUG MONITORING, PANEL 8 WITH CONFIRMATION, URINE
6 Acetylmorphine: NEGATIVE ng/mL (ref <10)
Alcohol Metabolites: NEGATIVE ng/mL
Amphetamines: NEGATIVE ng/mL (ref <500)
Benzodiazepines: NEGATIVE ng/mL (ref <100)
Buprenorphine, Urine: NEGATIVE ng/mL (ref <5)
Cocaine Metabolite: NEGATIVE ng/mL (ref <150)
Creatinine: 101.9 mg/dL
MDMA: NEGATIVE ng/mL (ref <500)
Marijuana Metabolite: NEGATIVE ng/mL (ref <20)
Opiates: NEGATIVE ng/mL (ref <100)
Oxidant: NEGATIVE ug/mL
Oxycodone: NEGATIVE ng/mL (ref <100)
pH: 5.7 (ref 4.5–9.0)

## 2019-08-01 LAB — DM TEMPLATE

## 2019-09-16 ENCOUNTER — Ambulatory Visit: Payer: Medicare PPO | Admitting: Family

## 2019-10-18 ENCOUNTER — Other Ambulatory Visit: Payer: Self-pay | Admitting: Family

## 2019-11-17 ENCOUNTER — Ambulatory Visit: Payer: Medicare PPO | Admitting: Physician Assistant

## 2019-11-19 ENCOUNTER — Other Ambulatory Visit: Payer: Self-pay | Admitting: Family

## 2019-11-20 ENCOUNTER — Other Ambulatory Visit: Payer: Self-pay | Admitting: Family

## 2019-11-20 MED ORDER — ALPRAZOLAM 0.5 MG PO TABS: 0.5000 mg | ORAL_TABLET | Freq: Three times a day (TID) | ORAL | 0 refills | Status: DC | PRN

## 2019-11-20 NOTE — Addendum Note (Signed)
Addended by: Sandford Craze on: 11/20/2019 05:09 PM   Modules accepted: Orders

## 2019-11-20 NOTE — Telephone Encounter (Signed)
Requesting: alprazolam 0.5mg  Contract: 07/31/2019 UDS: 07/31/2019 Last Visit: 07/31/2019 Next Visit: None scheduled Last Refill: 07/31/2019 #30 and 0RF Pt sig: 1 tab tid prn  Please Advise

## 2020-02-27 DIAGNOSIS — U071 COVID-19: Secondary | ICD-10-CM | POA: Diagnosis not present

## 2020-03-30 DIAGNOSIS — S90211A Contusion of right great toe with damage to nail, initial encounter: Secondary | ICD-10-CM | POA: Diagnosis not present

## 2020-03-30 DIAGNOSIS — B351 Tinea unguium: Secondary | ICD-10-CM | POA: Diagnosis not present

## 2020-03-30 DIAGNOSIS — E1140 Type 2 diabetes mellitus with diabetic neuropathy, unspecified: Secondary | ICD-10-CM | POA: Diagnosis not present

## 2020-03-30 DIAGNOSIS — E1165 Type 2 diabetes mellitus with hyperglycemia: Secondary | ICD-10-CM | POA: Diagnosis not present

## 2020-03-31 DIAGNOSIS — E113291 Type 2 diabetes mellitus with mild nonproliferative diabetic retinopathy without macular edema, right eye: Secondary | ICD-10-CM | POA: Diagnosis not present

## 2020-03-31 DIAGNOSIS — N182 Chronic kidney disease, stage 2 (mild): Secondary | ICD-10-CM | POA: Diagnosis not present

## 2020-03-31 DIAGNOSIS — E785 Hyperlipidemia, unspecified: Secondary | ICD-10-CM | POA: Diagnosis not present

## 2020-03-31 DIAGNOSIS — I129 Hypertensive chronic kidney disease with stage 1 through stage 4 chronic kidney disease, or unspecified chronic kidney disease: Secondary | ICD-10-CM | POA: Diagnosis not present

## 2020-03-31 DIAGNOSIS — E1122 Type 2 diabetes mellitus with diabetic chronic kidney disease: Secondary | ICD-10-CM | POA: Diagnosis not present

## 2020-05-08 ENCOUNTER — Other Ambulatory Visit: Payer: Self-pay | Admitting: Family

## 2020-06-28 DIAGNOSIS — I129 Hypertensive chronic kidney disease with stage 1 through stage 4 chronic kidney disease, or unspecified chronic kidney disease: Secondary | ICD-10-CM | POA: Diagnosis not present

## 2020-06-28 DIAGNOSIS — E785 Hyperlipidemia, unspecified: Secondary | ICD-10-CM | POA: Diagnosis not present

## 2020-06-28 DIAGNOSIS — E1122 Type 2 diabetes mellitus with diabetic chronic kidney disease: Secondary | ICD-10-CM | POA: Diagnosis not present

## 2020-06-28 DIAGNOSIS — N182 Chronic kidney disease, stage 2 (mild): Secondary | ICD-10-CM | POA: Diagnosis not present

## 2020-09-29 DIAGNOSIS — E1122 Type 2 diabetes mellitus with diabetic chronic kidney disease: Secondary | ICD-10-CM | POA: Diagnosis not present

## 2020-09-29 DIAGNOSIS — E113291 Type 2 diabetes mellitus with mild nonproliferative diabetic retinopathy without macular edema, right eye: Secondary | ICD-10-CM | POA: Diagnosis not present

## 2020-09-29 DIAGNOSIS — I129 Hypertensive chronic kidney disease with stage 1 through stage 4 chronic kidney disease, or unspecified chronic kidney disease: Secondary | ICD-10-CM | POA: Diagnosis not present

## 2020-09-29 DIAGNOSIS — N182 Chronic kidney disease, stage 2 (mild): Secondary | ICD-10-CM | POA: Diagnosis not present

## 2020-09-29 DIAGNOSIS — E785 Hyperlipidemia, unspecified: Secondary | ICD-10-CM | POA: Diagnosis not present

## 2020-11-09 ENCOUNTER — Ambulatory Visit: Payer: Medicare PPO | Admitting: Family

## 2020-11-09 ENCOUNTER — Telehealth (HOSPITAL_BASED_OUTPATIENT_CLINIC_OR_DEPARTMENT_OTHER): Payer: Self-pay

## 2020-11-09 ENCOUNTER — Other Ambulatory Visit: Payer: Self-pay

## 2020-11-09 ENCOUNTER — Telehealth: Payer: Self-pay | Admitting: Family

## 2020-11-09 VITALS — BP 144/70 | HR 88 | Resp 18 | Ht 66.0 in | Wt 179.6 lb

## 2020-11-09 DIAGNOSIS — E785 Hyperlipidemia, unspecified: Secondary | ICD-10-CM | POA: Diagnosis not present

## 2020-11-09 DIAGNOSIS — Z23 Encounter for immunization: Secondary | ICD-10-CM

## 2020-11-09 DIAGNOSIS — F418 Other specified anxiety disorders: Secondary | ICD-10-CM

## 2020-11-09 DIAGNOSIS — I10 Essential (primary) hypertension: Secondary | ICD-10-CM | POA: Diagnosis not present

## 2020-11-09 DIAGNOSIS — F419 Anxiety disorder, unspecified: Secondary | ICD-10-CM

## 2020-11-09 DIAGNOSIS — M810 Age-related osteoporosis without current pathological fracture: Secondary | ICD-10-CM | POA: Diagnosis not present

## 2020-11-09 DIAGNOSIS — E119 Type 2 diabetes mellitus without complications: Secondary | ICD-10-CM

## 2020-11-09 DIAGNOSIS — Z79899 Other long term (current) drug therapy: Secondary | ICD-10-CM

## 2020-11-09 LAB — COMPREHENSIVE METABOLIC PANEL
ALT: 23 U/L (ref 0–35)
AST: 21 U/L (ref 0–37)
Albumin: 4.3 g/dL (ref 3.5–5.2)
Alkaline Phosphatase: 104 U/L (ref 39–117)
BUN: 23 mg/dL (ref 6–23)
CO2: 24 mEq/L (ref 19–32)
Calcium: 9.9 mg/dL (ref 8.4–10.5)
Chloride: 106 mEq/L (ref 96–112)
Creatinine, Ser: 0.86 mg/dL (ref 0.40–1.20)
GFR: 66.79 mL/min (ref >60.00)
Glucose, Bld: 178 mg/dL — ABNORMAL HIGH (ref 70–99)
Potassium: 4 mEq/L (ref 3.5–5.1)
Sodium: 139 mEq/L (ref 135–145)
Total Bilirubin: 0.6 mg/dL (ref 0.2–1.2)
Total Protein: 6.8 g/dL (ref 6.0–8.3)

## 2020-11-09 LAB — LIPID PANEL
Cholesterol: 202 mg/dL — ABNORMAL HIGH (ref 0–200)
HDL: 64 mg/dL (ref >39.00)
LDL Cholesterol: 112 mg/dL — ABNORMAL HIGH (ref 0–99)
NonHDL: 138.02
Total CHOL/HDL Ratio: 3
Triglycerides: 129 mg/dL (ref 0.0–149.0)
VLDL: 25.8 mg/dL (ref 0.0–40.0)

## 2020-11-09 MED ORDER — EZETIMIBE 10 MG PO TABS: 10.0000 mg | ORAL_TABLET | Freq: Every day | ORAL | 1 refills | Status: DC

## 2020-11-09 MED ORDER — ESCITALOPRAM OXALATE 20 MG PO TABS: 20.0000 mg | ORAL_TABLET | Freq: Every day | ORAL | 1 refills | Status: DC

## 2020-11-09 MED ORDER — LOSARTAN POTASSIUM 50 MG PO TABS: 50.0000 mg | ORAL_TABLET | Freq: Every day | ORAL | 1 refills | Status: DC

## 2020-11-09 MED ORDER — ALPRAZOLAM 0.5 MG PO TABS: 0.5000 mg | ORAL_TABLET | Freq: Three times a day (TID) | ORAL | 0 refills | Status: DC | PRN

## 2020-11-09 NOTE — Assessment & Plan Note (Signed)
Fair BP control for her age. Continue losartan 50mg  once daily.

## 2020-11-09 NOTE — Telephone Encounter (Signed)
Please call and request eye exam from Dr. Vickey Sages in [facility].

## 2020-11-09 NOTE — Telephone Encounter (Signed)
Also, please abstract A1C from Care Everywhere.  

## 2020-11-09 NOTE — Progress Notes (Addendum)
Subjective:   By signing my name below, I, Shehryar Baig, attest that this documentation has been prepared under the direction and in the presence of Debbrah Alar NP. 11/09/2020    Patient ID: Wendy Guzman, female    DOB: [birth year], 75 y.o.   MRN: [id number]  Chief Complaint  Patient presents with   Medication Refill    Medication Refill  Patient is in today for a office visit.  Blood sugar- She continues seeing Dr. Posey Pronto to manage her blood sugar. Her last a1c was 9.6. She continues taking 25 mg jardiance daily PO and reports no new issues while taking it. She reports that Ozempic was not available at her primary pharmacy at [place] but found it at [facility].   Lab Results  Component Value Date   HGBA1C 11.8 (H) 12/19/2018   Mood- She has not taken lexapro for the past 2 months due to running out of the prescription. She reports her mood worsened while off of it. She is requesting a refill for that as well. She continues taking 0.5 mg xanax PRN and reports taking it at least 2x monthly. She is requesting a refill on it as well.  Cholesterol- Her cholesterol were slightly elevated during her last lab work. She continues taking 10 mg Zetia daily PO to manage her cholesterol. She notes that she gets muscle pain while taking statins.  Lab Results  Component Value Date   CHOL 221 (H) 12/19/2018   HDL 59.90 12/19/2018   LDLCALC 143 (H) 12/19/2018   TRIG 88.0 12/19/2018   CHOLHDL 4 12/19/2018   Blood pressure- Her blood pressure is doing well during this visit. She continues taking 50 mg losartan daily PO to manage her blood pressure and reports no new issues while taking it.   BP Readings from Last 3 Encounters:  11/09/20 (!) 144/70  07/31/19 134/80  03/03/19 133/86   Pulse Readings from Last 3 Encounters:  11/09/20 88  07/31/19 95  03/03/19 88   Allergies- She continue staking Flonase PRN to manage her allergies.  Vision- She is UTD on vision care.   Immunizations- She is not interested in getting hepatitis C screening. She has taken the flu vaccine during this visit. She has 3 Covid-19 vaccines at this time and is interested in getting the new booster vaccine at a later date at her pharmacy.  Mammogram- She reports getting her mammogram completed last year at [facility].    Health Maintenance Due  Topic Date Due   TETANUS/TDAP  Never done   OPHTHALMOLOGY EXAM  05/01/2019   HEMOGLOBIN A1C  06/19/2019   MAMMOGRAM  01/15/2020   COVID-19 Vaccine (4 - Booster for [employer] series) 02/12/2020    Past Medical History:  Diagnosis Date   Colon polyps    Depression    Diabetes mellitus without complication (HCC)    Frequent headaches    History of chicken pox    Hyperlipidemia    Hypertension     Past Surgical History:  Procedure Laterality Date   TONSILLECTOMY  6s   TUBAL LIGATION  1976    Family History  Problem Relation Age of Onset   COPD Mother    Aneurysm Mother        AAA   Cancer Father        lung   Diabetes Father    Diabetes Paternal Uncle    Hypertension Maternal Grandmother    Atrial fibrillation Brother    Diabetes Mellitus II Brother  Atrial fibrillation Brother    Diabetes Mellitus II Brother     Social History   Socioeconomic History   Marital status: Married    Spouse name: Not on file   Number of children: Not on file   Years of education: Not on file   Highest education level: Not on file  Occupational History   Not on file  Tobacco Use   Smoking status: Never   Smokeless tobacco: Never  Substance and Sexual Activity   Alcohol use: Yes    Alcohol/week: 2.0 - 4.0 standard drinks    Types: 2 - 4 Glasses of wine per week   Drug use: No   Sexual activity: Not on file  Other Topics Concern   Not on file  Social History Narrative   Retired Pharmacist, hospital and ten worked at the [employer]   Married   3 children, all are Radiation protection practitioner ([place]   Enjoys reading, Barrister's clerk,  quilting/crocheting   Social Determinants of Radio broadcast assistant Strain: Not on file  Food Insecurity: Not on file  Transportation Needs: Not on file  Physical Activity: Not on file  Stress: Not on file  Social Connections: Not on file  Intimate Partner Violence: Not on file    Outpatient Medications Prior to Visit  Medication Sig Dispense Refill   aspirin EC 81 MG tablet Take 1 tablet (81 mg total) by mouth daily.     betamethasone dipropionate (DIPROLENE) 0.05 % cream Apply topically 2 (two) times daily. 30 g 0   Calcium Carbonate-Vitamin D 600-400 MG-UNIT tablet Take 1 tablet by mouth 2 (two) times daily.     Cholecalciferol (VITAMIN D3) 10000 units TABS Take 3 tablets by mouth daily.     fluticasone (FLONASE) 50 MCG/ACT nasal spray SPRAY 2 SPRAYS INTO EACH NOSTRIL EVERY DAY 48 mL 1   insulin degludec (TRESIBA) 100 UNIT/ML FlexTouch Pen Use as directed. Max daily dose is 50 units.     JARDIANCE 25 MG TABS tablet Take 25 mg by mouth daily.     ALPRAZolam (XANAX) 0.5 MG tablet Take 1 tablet (0.5 mg total) by mouth 3 (three) times daily as needed. 30 tablet 0   escitalopram (LEXAPRO) 20 MG tablet TAKE 1 TABLET BY MOUTH EVERY DAY 90 tablet 1   ezetimibe (ZETIA) 10 MG tablet TAKE 1 TABLET BY MOUTH EVERY DAY 90 tablet 1   losartan (COZAAR) 50 MG tablet Take 1 tablet (50 mg total) by mouth daily. 90 tablet 1   No facility-administered medications prior to visit.    Allergies  Allergen Reactions   Ezetimibe-Simvastatin Other (See Comments)    Extreme muscle pain and weakness   Lovastatin Other (See Comments)    Extreme muscle pain and weakness   Rosuvastatin Other (See Comments)    Extreme muscle pain and weakness    ROS See HPI    Objective:    Physical Exam Constitutional:      General: She is not in acute distress.    Appearance: Normal appearance. She is well-developed. She is not ill-appearing.  HENT:     Head: Normocephalic and atraumatic.     Right Ear:  External ear normal.     Left Ear: External ear normal.  Eyes:     Extraocular Movements: Extraocular movements intact.     Pupils: Pupils are equal, round, and reactive to light.  Cardiovascular:     Rate and Rhythm: Normal rate and regular rhythm.     Heart sounds: Normal heart  sounds. No murmur heard.   No gallop.  Pulmonary:     Effort: Pulmonary effort is normal. No respiratory distress.     Breath sounds: Normal breath sounds. No wheezing or rales.  Skin:    General: Skin is warm and dry.     Comments: Left great toenail was thickened and discolored with some dry blood under toenail  Neurological:     Mental Status: She is alert and oriented to person, place, and time.  Psychiatric:        Behavior: Behavior normal.        Thought Content: Thought content normal.        Judgment: Judgment normal.   Diabetic Foot Exam - Simple   Simple Foot Form Diabetic Foot exam was performed with the following findings: Yes 11/09/2020  8:04 AM  Visual Inspection No deformities, no ulcerations, no other skin breakdown bilaterally: Yes Sensation Testing Intact to touch and monofilament testing bilaterally: Yes Pulse Check Posterior Tibialis and Dorsalis pulse intact bilaterally: Yes Comments     BP (!) 144/70   Pulse 88   Resp 18   Ht _0  (1.676 m)   Wt 179 lb 9.6 oz (81.5 kg)   SpO2 97%   BMI 28.99 kg/m  Wt Readings from Last 3 Encounters:  11/09/20 179 lb 9.6 oz (81.5 kg)  07/31/19 182 lb (82.6 kg)  03/03/19 174 lb 12.8 oz (79.3 kg)       Assessment & Plan:   Problem List Items Addressed This Visit       Unprioritized   Hyperlipidemia    Last LDL was slightly above goal with endocrinology. Will repeat lipid panel. Continue zetia.       Relevant Medications   ezetimibe (ZETIA) 10 MG tablet   losartan (COZAAR) 50 MG tablet   Other Relevant Orders   Lipid panel   HTN (hypertension)    Fair BP control for her age. Continue losartan 78m once daily.        Relevant Medications   ezetimibe (ZETIA) 10 MG tablet   losartan (COZAAR) 50 MG tablet   Other Relevant Orders   Comp Met (CMET)   DM2 (diabetes mellitus, type 2) (HCC)    Uncontrolled. She continues to work with endocrinology.       Relevant Medications   losartan (COZAAR) 50 MG tablet   Depression with anxiety - Primary    Uncontrolled since she ran out of lexapro. Will resume.       Relevant Medications   escitalopram (LEXAPRO) 20 MG tablet   ALPRAZolam (XANAX) 0.5 MG tablet   Other Relevant Orders   Drug Monitoring Panel [phone], Urine   Other Visit Diagnoses     High risk medication use       Relevant Orders   Drug Monitoring Panel [phone], Urine   Anxiety       Relevant Medications   escitalopram (LEXAPRO) 20 MG tablet   ALPRAZolam (XANAX) 0.5 MG tablet   Other Relevant Orders   DRUG MONITORING, PANEL 8 WITH CONFIRMATION, URINE   Need for influenza vaccination       Relevant Orders   Flu Vaccine QUAD High Dose(Fluad) (Completed)   Osteoporosis without current pathological fracture, unspecified osteoporosis type       Relevant Orders   DG Bone Density        Meds ordered this encounter  Medications   escitalopram (LEXAPRO) 20 MG tablet    Sig: Take 1 tablet (20 mg  total) by mouth daily.    Dispense:  90 tablet    Refill:  1    Order Specific Question:   Supervising Provider    Answer:   Penni Homans A [[id number]]   ALPRAZolam (XANAX) 0.5 MG tablet    Sig: Take 1 tablet (0.5 mg total) by mouth 3 (three) times daily as needed.    Dispense:  30 tablet    Refill:  0    Order Specific Question:   Supervising Provider    Answer:   Penni Homans A [[id number]]   ezetimibe (ZETIA) 10 MG tablet    Sig: Take 1 tablet (10 mg total) by mouth daily.    Dispense:  90 tablet    Refill:  1    Order Specific Question:   Supervising Provider    Answer:   Penni Homans A [[id number]]   losartan (COZAAR) 50 MG tablet    Sig: Take 1 tablet (50 mg total) by mouth daily.    Dispense:   90 tablet    Refill:  1    Order Specific Question:   Supervising Provider    Answer:   Penni Homans A [[id number]]    I, Debbrah Alar NP, personally preformed the services described in this documentation.  All medical record entries made by the scribe were at my direction and in my presence.  I have reviewed the chart and discharge instructions (if applicable) and agree that the record reflects my personal performance and is accurate and complete. 11/09/2020   I,Shehryar Baig,acting as a Education administrator for [name], NP.,have documented all relevant documentation on the behalf of [name], NP,as directed by  [name], NP while in the presence of [name], NP.   [name], NP

## 2020-11-09 NOTE — Assessment & Plan Note (Signed)
Last LDL was slightly above goal with endocrinology. Will repeat lipid panel. Continue zetia.

## 2020-11-09 NOTE — Addendum Note (Signed)
Addended by: Rosita Kea on: 11/09/2020 10:55 AM   Modules accepted: Orders

## 2020-11-09 NOTE — Assessment & Plan Note (Signed)
Uncontrolled. She continues to work with endocrinology.

## 2020-11-09 NOTE — Telephone Encounter (Signed)
Patient advised she needs bone density and will get a  call to set up.

## 2020-11-09 NOTE — Telephone Encounter (Signed)
Please request mammogram report from [facility].

## 2020-11-09 NOTE — Assessment & Plan Note (Signed)
Uncontrolled since she ran out of lexapro. Will resume.

## 2020-11-12 LAB — DRUG MONITORING PANEL 376104, URINE
Amphetamines: NEGATIVE ng/mL (ref <500)
Barbiturates: NEGATIVE ng/mL (ref <300)
Benzodiazepines: NEGATIVE ng/mL (ref <100)
Cocaine Metabolite: NEGATIVE ng/mL (ref <150)
Desmethyltramadol: NEGATIVE ng/mL (ref <100)
Opiates: NEGATIVE ng/mL (ref <100)
Oxycodone: NEGATIVE ng/mL (ref <100)
Tramadol: NEGATIVE ng/mL (ref <100)

## 2020-11-12 LAB — DM TEMPLATE

## 2020-11-25 ENCOUNTER — Telehealth (HOSPITAL_BASED_OUTPATIENT_CLINIC_OR_DEPARTMENT_OTHER): Payer: Self-pay

## 2020-12-05 ENCOUNTER — Telehealth: Payer: Self-pay | Admitting: Family

## 2020-12-05 NOTE — Telephone Encounter (Signed)
Left message for patient to call back and schedule Medicare Annual Wellness Visit (AWV) in office.  ° °If not able to come in office, please offer to do virtually or by telephone.  Left office number and my jabber #[phone]. ° °Due for AWVI ° °Please schedule at anytime with Nurse Health Advisor. °  °

## 2020-12-29 ENCOUNTER — Other Ambulatory Visit (HOSPITAL_BASED_OUTPATIENT_CLINIC_OR_DEPARTMENT_OTHER): Payer: Self-pay | Admitting: Family

## 2020-12-29 DIAGNOSIS — M810 Age-related osteoporosis without current pathological fracture: Secondary | ICD-10-CM

## 2020-12-30 DIAGNOSIS — Z7984 Long term (current) use of oral hypoglycemic drugs: Secondary | ICD-10-CM | POA: Diagnosis not present

## 2020-12-30 DIAGNOSIS — Z7985 Long-term (current) use of injectable non-insulin antidiabetic drugs: Secondary | ICD-10-CM | POA: Diagnosis not present

## 2020-12-30 DIAGNOSIS — E1122 Type 2 diabetes mellitus with diabetic chronic kidney disease: Secondary | ICD-10-CM | POA: Diagnosis not present

## 2020-12-30 DIAGNOSIS — N182 Chronic kidney disease, stage 2 (mild): Secondary | ICD-10-CM | POA: Diagnosis not present

## 2020-12-30 DIAGNOSIS — R809 Proteinuria, unspecified: Secondary | ICD-10-CM | POA: Diagnosis not present

## 2020-12-30 DIAGNOSIS — E1129 Type 2 diabetes mellitus with other diabetic kidney complication: Secondary | ICD-10-CM | POA: Diagnosis not present

## 2020-12-30 DIAGNOSIS — E785 Hyperlipidemia, unspecified: Secondary | ICD-10-CM | POA: Diagnosis not present

## 2020-12-30 DIAGNOSIS — I129 Hypertensive chronic kidney disease with stage 1 through stage 4 chronic kidney disease, or unspecified chronic kidney disease: Secondary | ICD-10-CM | POA: Diagnosis not present

## 2020-12-30 DIAGNOSIS — E113291 Type 2 diabetes mellitus with mild nonproliferative diabetic retinopathy without macular edema, right eye: Secondary | ICD-10-CM | POA: Diagnosis not present

## 2021-01-03 ENCOUNTER — Telehealth (HOSPITAL_BASED_OUTPATIENT_CLINIC_OR_DEPARTMENT_OTHER): Payer: Self-pay

## 2021-02-02 ENCOUNTER — Telehealth: Payer: Self-pay | Admitting: Family

## 2021-02-02 NOTE — Telephone Encounter (Signed)
Left message for patient to call back and schedule Medicare Annual Wellness Visit (AWV) in office.  ° °If not able to come in office, please offer to do virtually or by telephone.  Left office number and my jabber #[phone]. ° °Due for AWVI ° °Please schedule at anytime with Nurse Health Advisor. °  °

## 2021-03-11 IMAGING — MG MM DIGITAL DIAGNOSTIC UNILAT*R* W/ TOMO W/ CAD
4 series · 4 of 12 positions shown · non-contrast
Comparison: Previous exam(s).

CLINICAL DATA: Recall from outside screening mammography with
tomosynthesis, possible mass in the INNER RIGHT breast at MIDDLE
depth.

EXAM:
DIGITAL DIAGNOSTIC RIGHT MAMMOGRAM WITH TOMO
ULTRASOUND RIGHT BREAST

[R CC synth-2D]
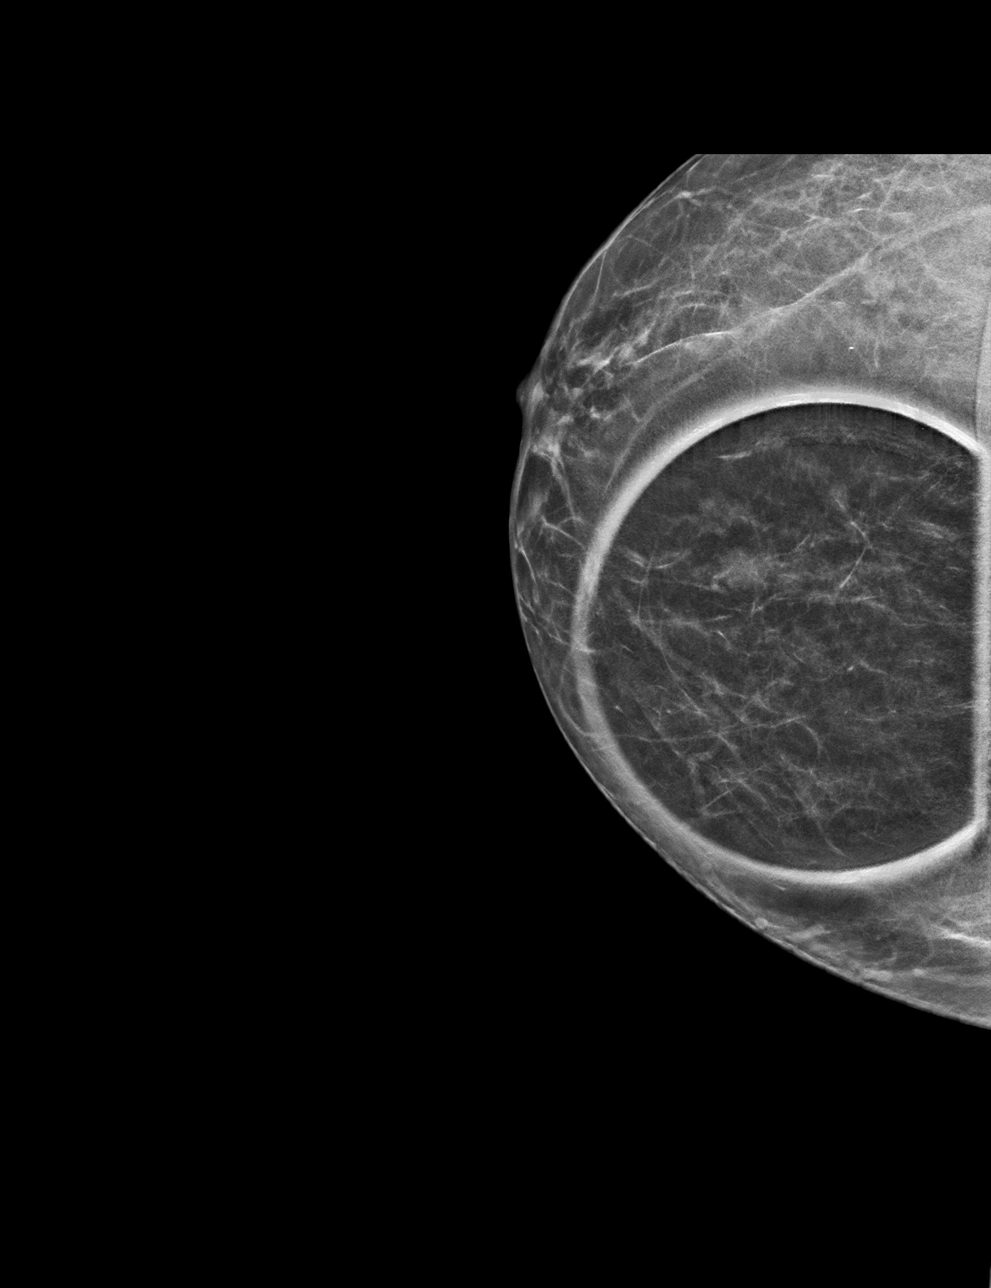

[R MLO synth-2D]
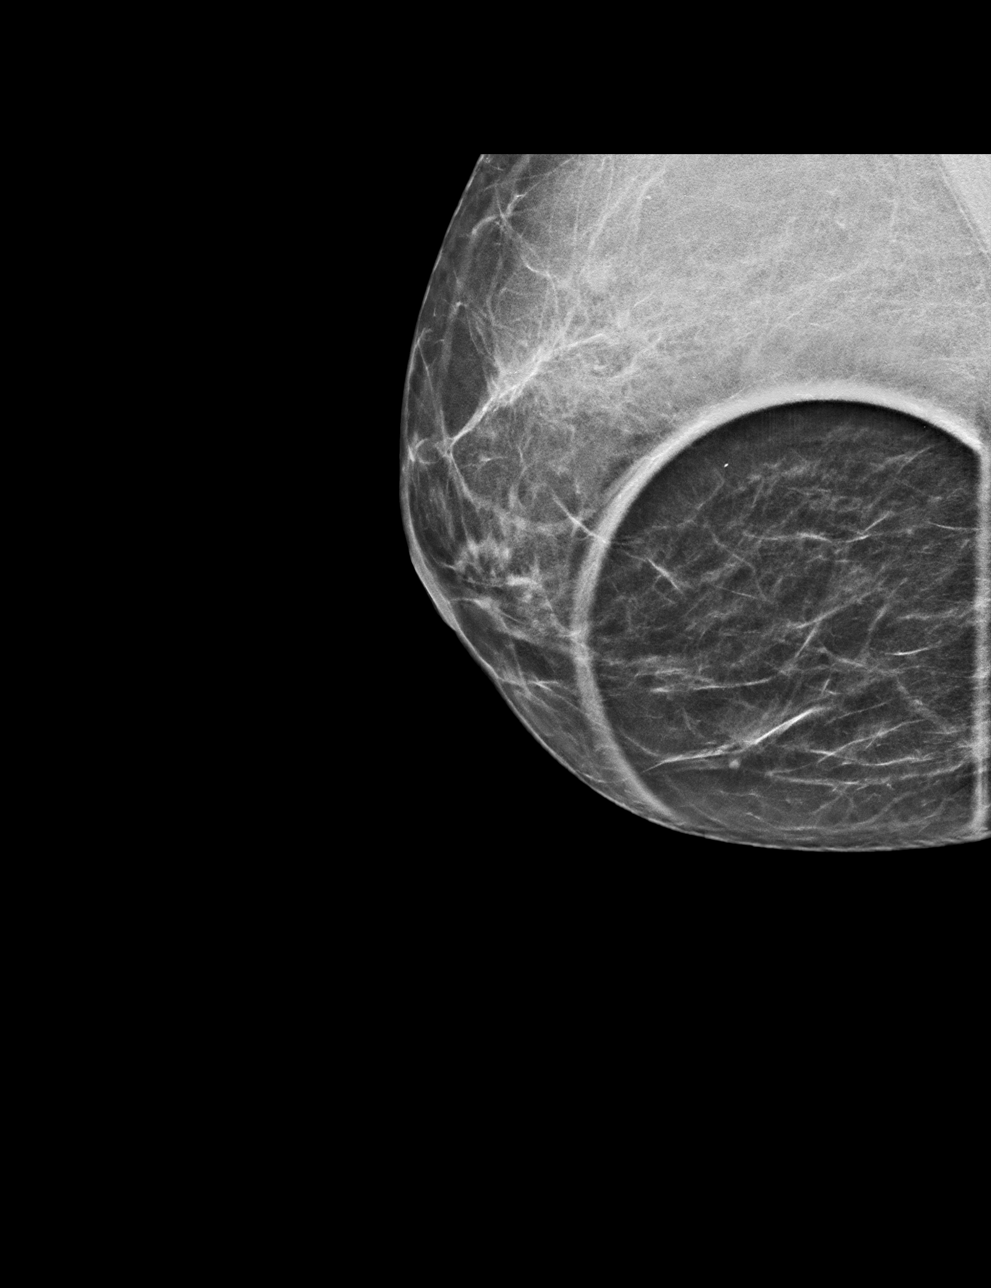

[R CC tomo · tomo slice 26/51.0]
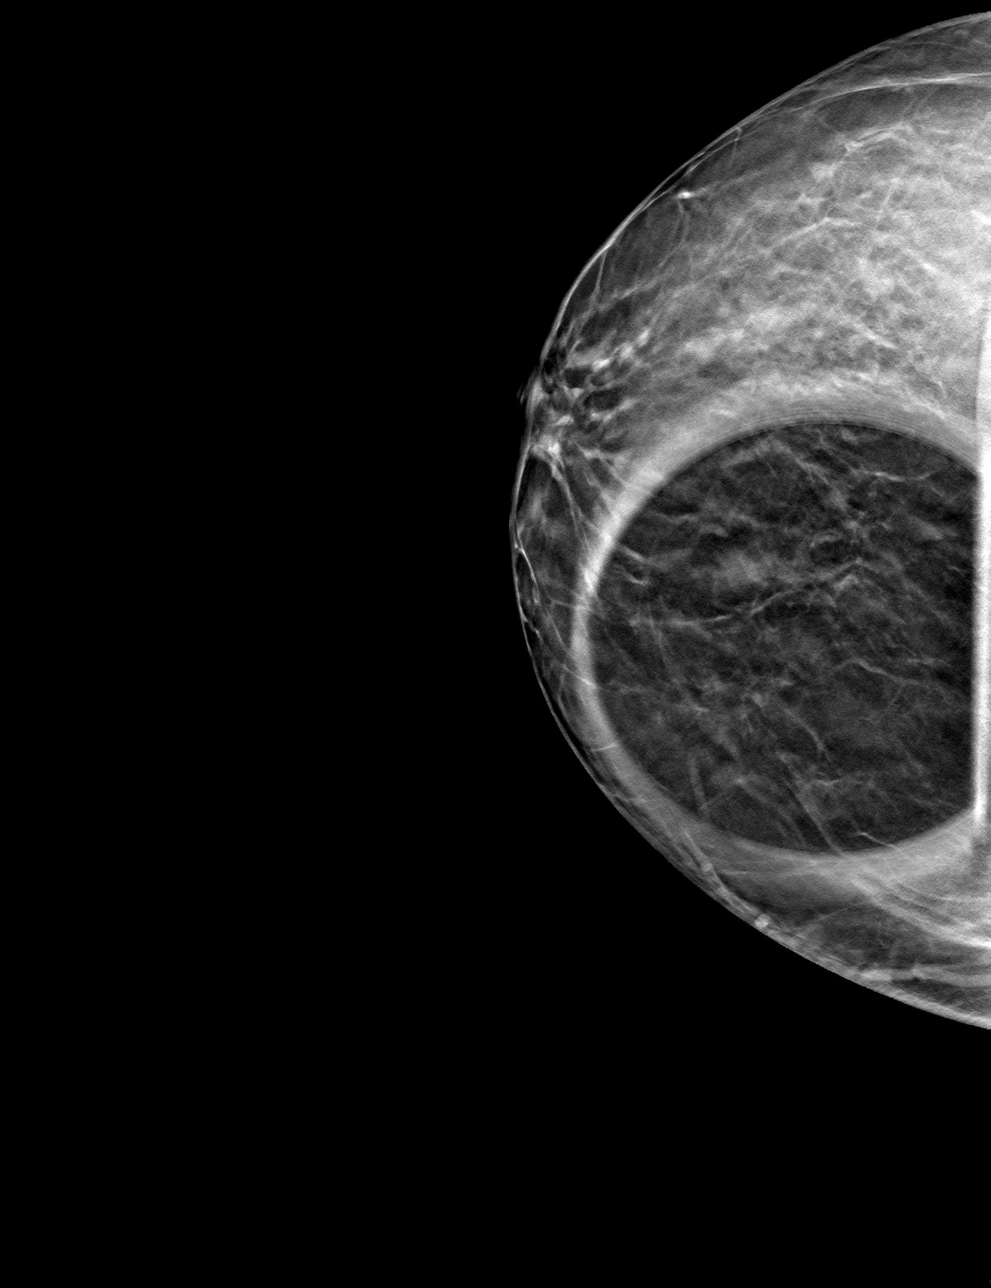

[R MLO tomo · tomo slice 27/53.0]
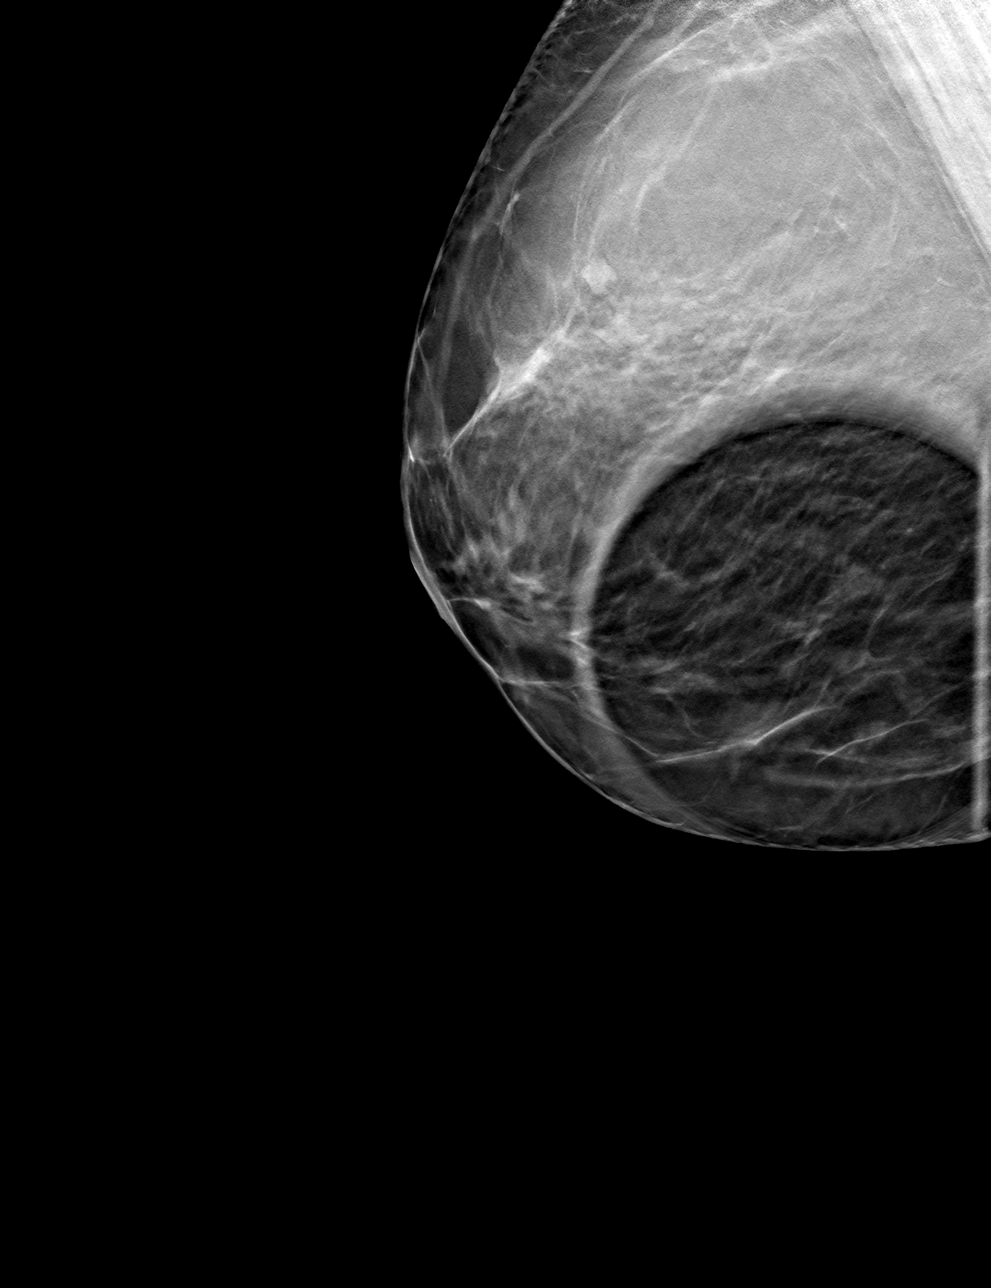

[4 of 12 positions shown; findings below may reference images not displayed]

ACR Breast Density Category b: There are scattered areas of
fibroglandular density.
FINDINGS: Tomosynthesis and synthesized spot-compression CC and MLO views of
the area of concern in the RIGHT breast were obtained.

The mass questioned in the RIGHT breast on the screening mammogram
nearly completely disperses with compression. I suspect a
circumscribed low-density mass measuring approximately 1 cm without
associated architectural distortion or suspicious calcifications.

Targeted RIGHT breast ultrasound is performed, showing an anechoic
mass with a thin internal septation at the 4 o'clock position
approximately 3 cm from nipple measuring approximately 0.5 x 0.8 x
1.0 cm, demonstrating posterior acoustic enhancement and no internal
power Doppler flow, corresponding to the screening mammographic
finding. No suspicious solid mass or abnormal acoustic shadowing is
identified.
IMPRESSION: 1. No mammographic or sonographic evidence of malignancy involving
the RIGHT breast.
2. Benign 1 cm cyst in the LOWER OUTER QUADRANT which accounts for
the screening mammographic finding.

RECOMMENDATION:
Screening mammogram in one year.(Code:JJ-K-S0U)

I have discussed the findings and recommendations with the patient.
If applicable, a reminder letter will be sent to the patient
regarding the next appointment.

BI-RADS CATEGORY  2: Benign.

## 2021-03-11 IMAGING — US US BREAST*R* LIMITED INC AXILLA
1 series · 5 of 5 positions shown · non-contrast
Comparison: Previous exam(s).

CLINICAL DATA: Recall from outside screening mammography with
tomosynthesis, possible mass in the INNER RIGHT breast at MIDDLE
depth.

EXAM:
DIGITAL DIAGNOSTIC RIGHT MAMMOGRAM WITH TOMO
ULTRASOUND RIGHT BREAST

[Series 1: us breast*right* limited inc axilla · 0.06mm/px · 5 of 5 slices shown]
[im 1/5]
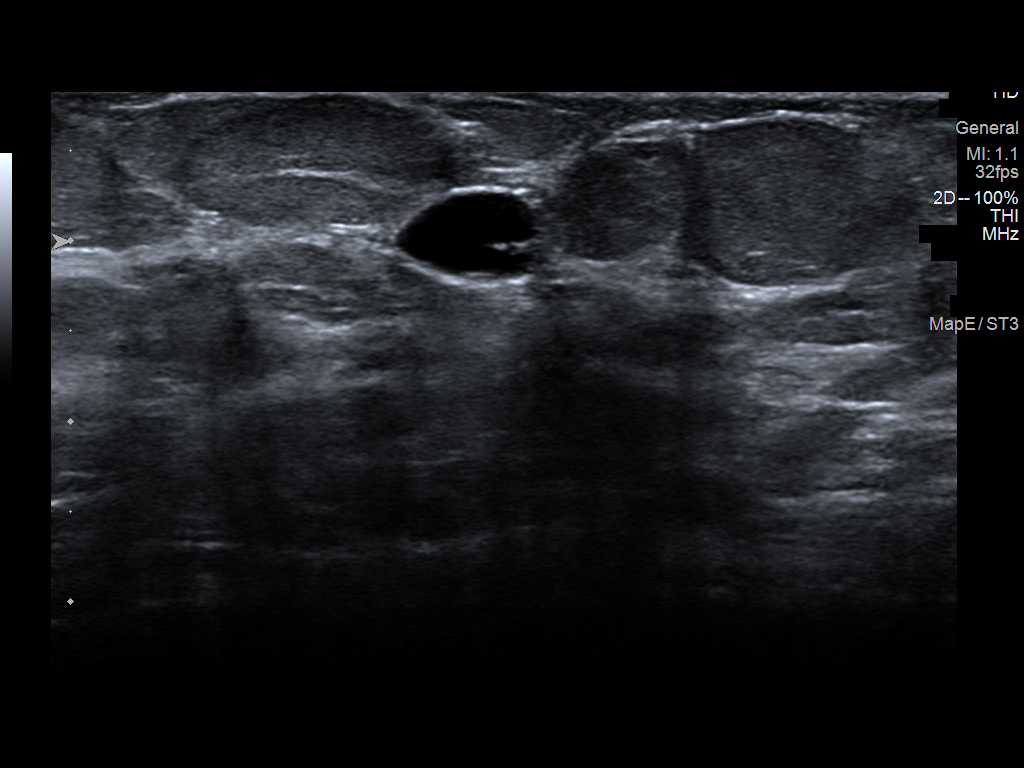
[im 2/5]
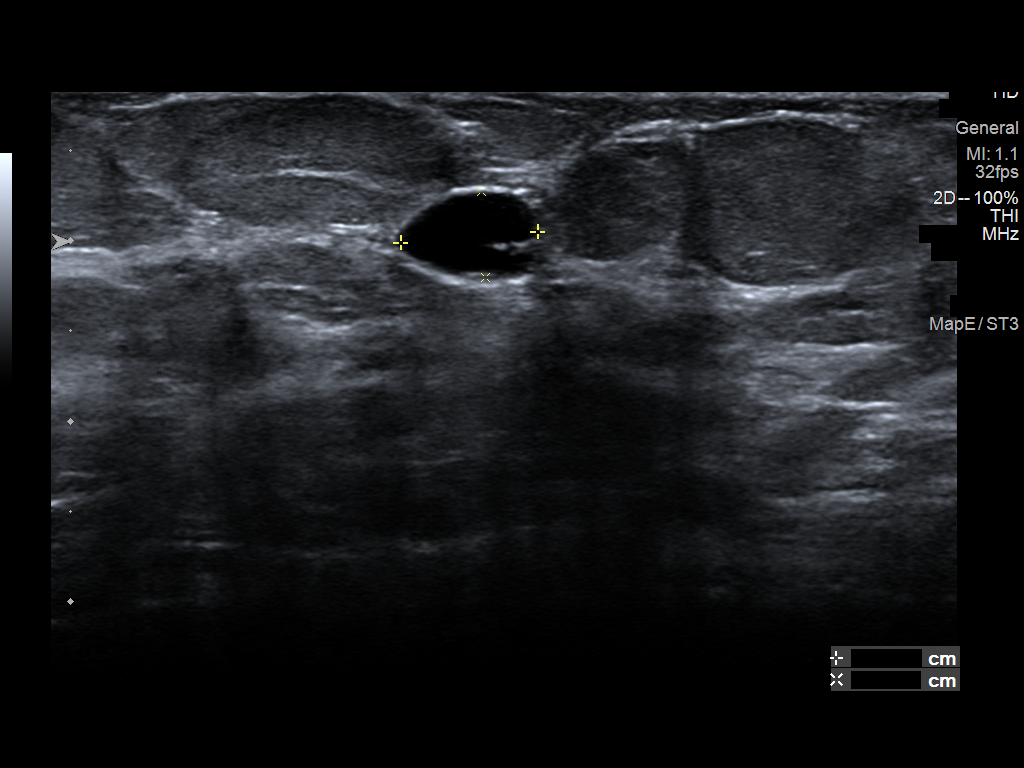
[im 3/5]
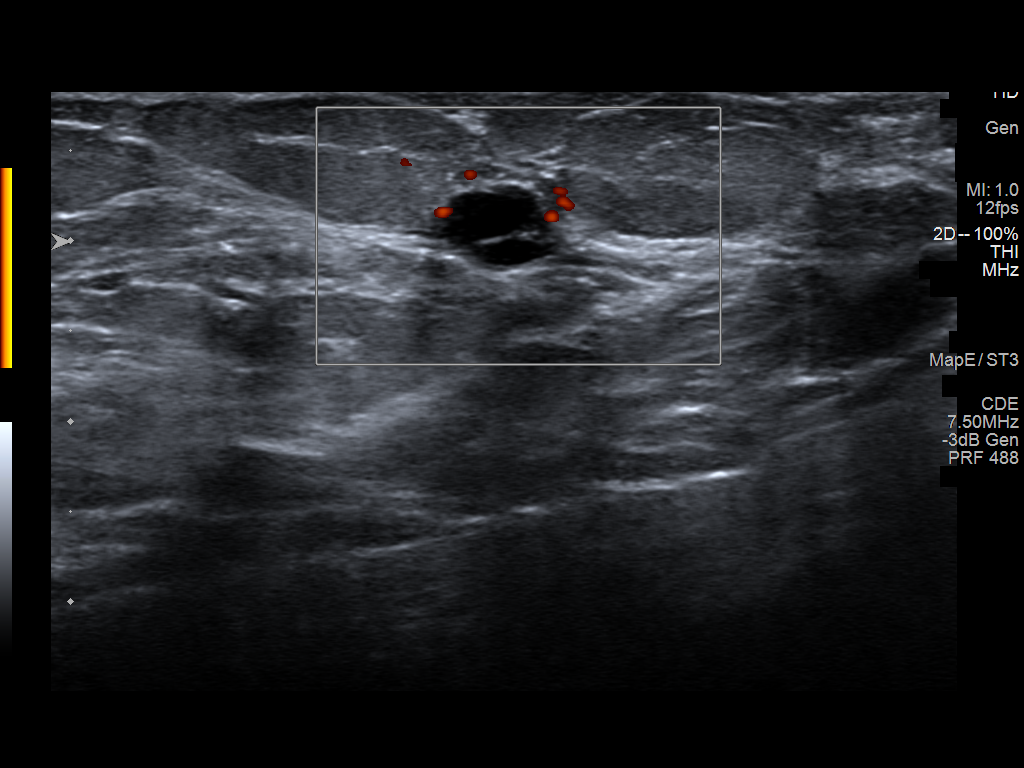
[im 4/5]
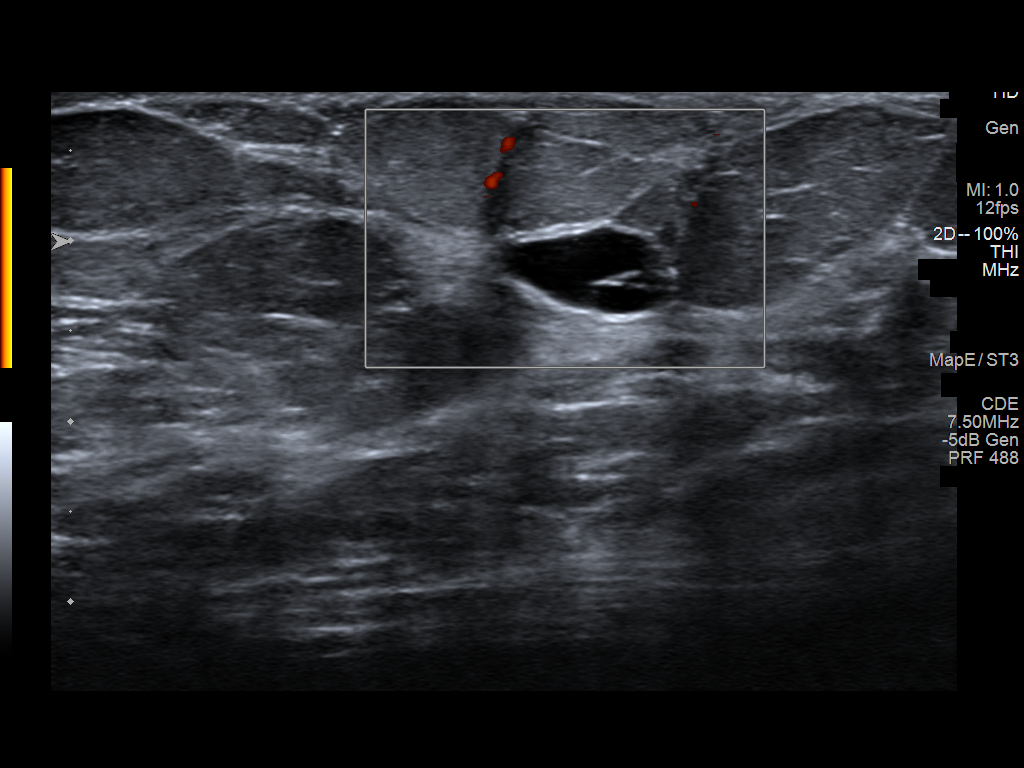
[im 5/5]
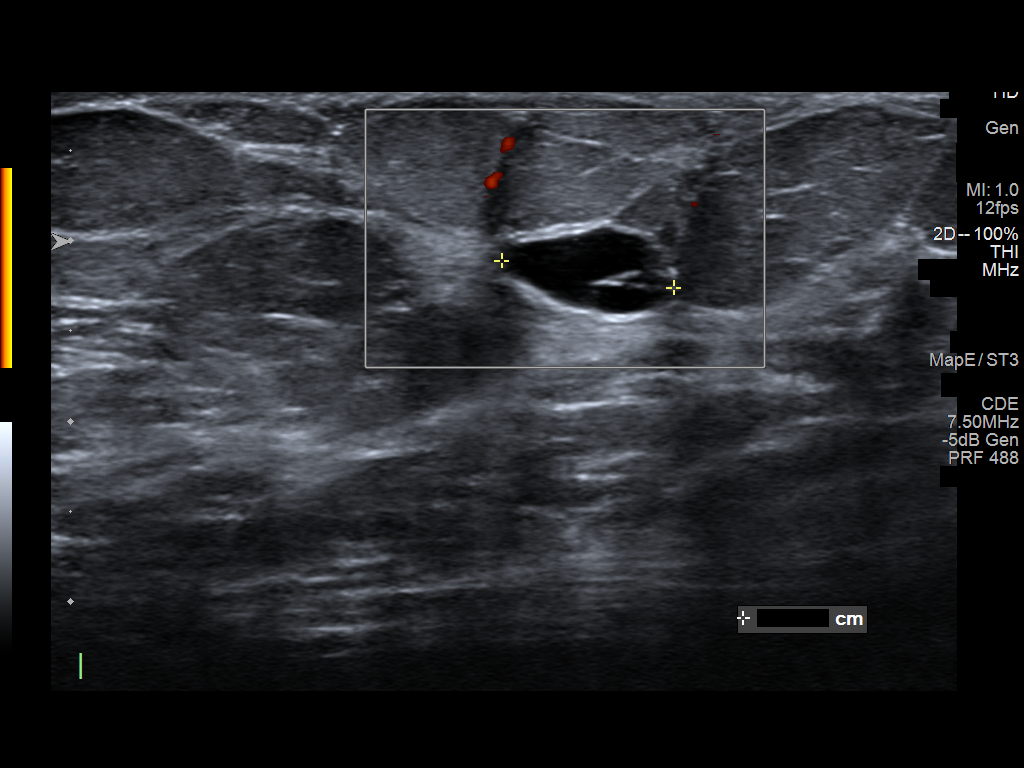

[5 of 5 positions shown; findings below may reference images not displayed]

ACR Breast Density Category b: There are scattered areas of
fibroglandular density.
FINDINGS: Tomosynthesis and synthesized spot-compression CC and MLO views of
the area of concern in the RIGHT breast were obtained.

The mass questioned in the RIGHT breast on the screening mammogram
nearly completely disperses with compression. I suspect a
circumscribed low-density mass measuring approximately 1 cm without
associated architectural distortion or suspicious calcifications.

Targeted RIGHT breast ultrasound is performed, showing an anechoic
mass with a thin internal septation at the 4 o'clock position
approximately 3 cm from nipple measuring approximately 0.5 x 0.8 x
1.0 cm, demonstrating posterior acoustic enhancement and no internal
power Doppler flow, corresponding to the screening mammographic
finding. No suspicious solid mass or abnormal acoustic shadowing is
identified.
IMPRESSION: 1. No mammographic or sonographic evidence of malignancy involving
the RIGHT breast.
2. Benign 1 cm cyst in the LOWER OUTER QUADRANT which accounts for
the screening mammographic finding.

RECOMMENDATION:
Screening mammogram in one year.(Code:JJ-K-S0U)

I have discussed the findings and recommendations with the patient.
If applicable, a reminder letter will be sent to the patient
regarding the next appointment.

BI-RADS CATEGORY  2: Benign.

## 2021-03-30 ENCOUNTER — Telehealth: Payer: Self-pay | Admitting: Family

## 2021-03-30 NOTE — Telephone Encounter (Signed)
Left message for patient to call back and schedule Medicare Annual Wellness Visit (AWV) in office.  ° °If not able to come in office, please offer to do virtually or by telephone.  Left office number and my jabber #[phone]. ° °Due for AWVI ° °Please schedule at anytime with Nurse Health Advisor. °  °

## 2021-04-03 DIAGNOSIS — I129 Hypertensive chronic kidney disease with stage 1 through stage 4 chronic kidney disease, or unspecified chronic kidney disease: Secondary | ICD-10-CM | POA: Diagnosis not present

## 2021-04-03 DIAGNOSIS — E785 Hyperlipidemia, unspecified: Secondary | ICD-10-CM | POA: Diagnosis not present

## 2021-04-03 DIAGNOSIS — E1122 Type 2 diabetes mellitus with diabetic chronic kidney disease: Secondary | ICD-10-CM | POA: Diagnosis not present

## 2021-04-03 DIAGNOSIS — Z7984 Long term (current) use of oral hypoglycemic drugs: Secondary | ICD-10-CM | POA: Diagnosis not present

## 2021-04-03 DIAGNOSIS — N182 Chronic kidney disease, stage 2 (mild): Secondary | ICD-10-CM | POA: Diagnosis not present

## 2021-04-03 DIAGNOSIS — E113291 Type 2 diabetes mellitus with mild nonproliferative diabetic retinopathy without macular edema, right eye: Secondary | ICD-10-CM | POA: Diagnosis not present

## 2021-04-03 DIAGNOSIS — Z7985 Long-term (current) use of injectable non-insulin antidiabetic drugs: Secondary | ICD-10-CM | POA: Diagnosis not present

## 2021-04-03 LAB — HEMOGLOBIN A1C: Hemoglobin A1C: 10.6

## 2021-05-04 ENCOUNTER — Other Ambulatory Visit: Payer: Self-pay | Admitting: Family

## 2021-05-10 ENCOUNTER — Ambulatory Visit: Payer: Medicare PPO | Admitting: Family

## 2021-05-10 ENCOUNTER — Telehealth: Payer: Self-pay | Admitting: Family

## 2021-05-10 VITALS — BP 129/77 | HR 85 | Temp 97.8°F | Resp 16 | Ht 66.0 in | Wt 176.0 lb

## 2021-05-10 DIAGNOSIS — F418 Other specified anxiety disorders: Secondary | ICD-10-CM | POA: Diagnosis not present

## 2021-05-10 DIAGNOSIS — E119 Type 2 diabetes mellitus without complications: Secondary | ICD-10-CM | POA: Diagnosis not present

## 2021-05-10 DIAGNOSIS — R42 Dizziness and giddiness: Secondary | ICD-10-CM | POA: Diagnosis not present

## 2021-05-10 DIAGNOSIS — I10 Essential (primary) hypertension: Secondary | ICD-10-CM | POA: Diagnosis not present

## 2021-05-10 DIAGNOSIS — E785 Hyperlipidemia, unspecified: Secondary | ICD-10-CM

## 2021-05-10 LAB — BASIC METABOLIC PANEL
BUN: 25 mg/dL — ABNORMAL HIGH (ref 6–23)
CO2: 28 mEq/L (ref 19–32)
Calcium: 9.9 mg/dL (ref 8.4–10.5)
Chloride: 103 mEq/L (ref 96–112)
Creatinine, Ser: 1.03 mg/dL (ref 0.40–1.20)
GFR: 53.6 mL/min — ABNORMAL LOW (ref >60.00)
Glucose, Bld: 177 mg/dL — ABNORMAL HIGH (ref 70–99)
Potassium: 4.4 mEq/L (ref 3.5–5.1)
Sodium: 138 mEq/L (ref 135–145)

## 2021-05-10 MED ORDER — ALPRAZOLAM 0.5 MG PO TABS: 0.5000 mg | ORAL_TABLET | Freq: Three times a day (TID) | ORAL | 0 refills | Status: DC | PRN

## 2021-05-10 NOTE — Assessment & Plan Note (Addendum)
New. Sounds like she had some vertigo symptoms over the weekend. Resolved now. Monitor.  ?

## 2021-05-10 NOTE — Telephone Encounter (Signed)
Records release request faxed to both.  

## 2021-05-10 NOTE — Patient Instructions (Signed)
Please get the bivalent covid booster.  ? ?

## 2021-05-10 NOTE — Telephone Encounter (Signed)
Please call Dr. Kennith Maes office to request a copy of her last mammogram.  ?

## 2021-05-10 NOTE — Progress Notes (Signed)
? ?Subjective:  ? ? ? Patient ID: Wendy Revereatherine Palmer Holdren, female    DOB: 07-25-1945, 76 y.o.   MRN: [id number] ? ?Chief Complaint  ?Patient presents with  ? Diabetes  ?  Here for follow up  ? Depression  ?  H/O depression with anxiety, doing well on Xanax  ? Dizziness  ?  Complains of dizziness since Saturday.   ? ? ?Diabetes ?Hypoglycemia symptoms include dizziness.  ?Depression ?      ?Dizziness ? ?Patient is in today for follow up. ? ?DM2- She is following with Dr. Allena KatzPatel. Last A1C 04/03/21 was 10.6.  Reports that she has not eating a healthy dm diet.  ? ?Lab Results  ?Component Value Date  ? HGBA1C 11.8 (H) 12/19/2018  ? HGBA1C 9.2 (H) 06/11/2016  ? HGBA1C 12.4 (H) 02/06/2016  ? ?Lab Results  ?Component Value Date  ? MICROALBUR 1.4 06/11/2016  ? LDLCALC 112 (H) 11/09/2020  ? CREATININE 0.86 11/09/2020  ? ?Depression- reports mood has been stable.  Continues lexapro 20mg .  Reports that she uses xanax once a week for anxiety.  ? ?Dizziness- When she sat up Saturday AM "felt like something was off."  Had nausea and vomited x 1 on Saturday night. A little "swimmy headed" on Monday AM but has been fine since then.  ? ?Health Maintenance Due  ?Topic Date Due  ? TETANUS/TDAP  Never done  ? OPHTHALMOLOGY EXAM  05/01/2019  ? HEMOGLOBIN A1C  06/19/2019  ? MAMMOGRAM  01/15/2020  ? COVID-19 Vaccine (4 - Booster for [employer] series) 01/15/2020  ? ? ?Past Medical History:  ?Diagnosis Date  ? Colon polyps   ? Depression   ? Diabetes mellitus without complication (HCC)   ? Frequent headaches   ? History of chicken pox   ? Hyperlipidemia   ? Hypertension   ? ? ?Past Surgical History:  ?Procedure Laterality Date  ? TONSILLECTOMY  1950s  ? TUBAL LIGATION  1976  ? ? ?Family History  ?Problem Relation Age of Onset  ? COPD Mother   ? Aneurysm Mother   ?     AAA  ? Cancer Father   ?     lung  ? Diabetes Father   ? Diabetes Paternal Uncle   ? Hypertension Maternal Grandmother   ? Atrial fibrillation Brother   ? Diabetes Mellitus II  Brother   ? Atrial fibrillation Brother   ? Diabetes Mellitus II Brother   ? ? ?Social History  ? ?Socioeconomic History  ? Marital status: Married  ?  Spouse name: Not on file  ? Number of children: Not on file  ? Years of education: Not on file  ? Highest education level: Not on file  ?Occupational History  ? Not on file  ?Tobacco Use  ? Smoking status: Never  ? Smokeless tobacco: Never  ?Substance and Sexual Activity  ? Alcohol use: Yes  ?  Alcohol/week: 2.0 - 4.0 standard drinks  ?  Types: 2 - 4 Glasses of wine per week  ? Drug use: No  ? Sexual activity: Not on file  ?Other Topics Concern  ? Not on file  ?Social History Narrative  ? Retired Runner, broadcasting/film/videoteacher and ten worked at the [employer]  ? Married  ? 3 children, all are local ([place])  ? Enjoys reading, crafting, quilting/crocheting  ? ?Social Determinants of Health  ? ?Financial Resource Strain: Not on file  ?Food Insecurity: Not on file  ?Transportation Needs: Not on file  ?Physical Activity: Not  on file  ?Stress: Not on file  ?Social Connections: Not on file  ?Intimate Partner Violence: Not on file  ? ? ?Outpatient Medications Prior to Visit  ?Medication Sig Dispense Refill  ? aspirin EC 81 MG tablet Take 1 tablet (81 mg total) by mouth daily.    ? betamethasone dipropionate (DIPROLENE) 0.05 % cream Apply topically 2 (two) times daily. 30 g 0  ? Calcium Carbonate-Vitamin D 600-400 MG-UNIT tablet Take 1 tablet by mouth 2 (two) times daily.    ? Cholecalciferol (VITAMIN D3) 10000 units TABS Take 3 tablets by mouth daily.    ? escitalopram (LEXAPRO) 20 MG tablet TAKE 1 TABLET BY MOUTH EVERY DAY 90 tablet 1  ? ezetimibe (ZETIA) 10 MG tablet Take 1 tablet (10 mg total) by mouth daily. 90 tablet 1  ? fluticasone (FLONASE) 50 MCG/ACT nasal spray SPRAY 2 SPRAYS INTO EACH NOSTRIL EVERY DAY 48 mL 1  ? insulin degludec (TRESIBA) 100 UNIT/ML FlexTouch Pen Use as directed. Max daily dose is 50 units.    ? JARDIANCE 25 MG TABS tablet Take 25 mg by mouth daily.    ?  losartan (COZAAR) 50 MG tablet Take 1 tablet (50 mg total) by mouth daily. 90 tablet 1  ? ALPRAZolam (XANAX) 0.5 MG tablet Take 1 tablet (0.5 mg total) by mouth 3 (three) times daily as needed. 30 tablet 0  ? ?No facility-administered medications prior to visit.  ? ? ?Allergies  ?Allergen Reactions  ? Ezetimibe-Simvastatin Other (See Comments)  ?  Extreme muscle pain and weakness  ? Lovastatin Other (See Comments)  ?  Extreme muscle pain and weakness  ? Rosuvastatin Other (See Comments)  ?  Extreme muscle pain and weakness  ? ? ?Review of Systems  ?Neurological:  Positive for dizziness.  ?Psychiatric/Behavioral:  Positive for depression.   ? ?   ?Objective:  ?  ?Physical Exam ?Constitutional:   ?   General: She is not in acute distress. ?   Appearance: Normal appearance. She is well-developed.  ?HENT:  ?   Head: Normocephalic and atraumatic.  ?   Right Ear: Tympanic membrane, ear canal and external ear normal.  ?   Left Ear: Tympanic membrane, ear canal and external ear normal.  ?Eyes:  ?   General: No scleral icterus. ?Neck:  ?   Thyroid: No thyromegaly.  ?Cardiovascular:  ?   Rate and Rhythm: Normal rate and regular rhythm.  ?   Heart sounds: Normal heart sounds. No murmur heard. ?Pulmonary:  ?   Effort: Pulmonary effort is normal. No respiratory distress.  ?   Breath sounds: Normal breath sounds. No wheezing.  ?Musculoskeletal:  ?   Cervical back: Neck supple.  ?Skin: ?   General: Skin is warm and dry.  ?Neurological:  ?   Mental Status: She is alert and oriented to person, place, and time.  ?Psychiatric:     ?   Mood and Affect: Mood normal.     ?   Behavior: Behavior normal.     ?   Thought Content: Thought content normal.     ?   Judgment: Judgment normal.  ? ? ?BP 129/77 (BP Location: Right Arm, Patient Position: Sitting, Cuff Size: Small)   Pulse 85   Temp 97.8 ?F (36.6 ?C) (Oral)   Resp 16   Ht 5\' 6"  (1.676 m)   Wt 176 lb (79.8 kg)   SpO2 99%   BMI 28.41 kg/m?  ?Wt Readings from Last 3 Encounters:   ?  05/10/21 176 lb (79.8 kg)  ?11/09/20 179 lb 9.6 oz (81.5 kg)  ?07/31/19 182 lb (82.6 kg)  ? ? ?   ?Assessment & Plan:  ? ?Problem List Items Addressed This Visit   ? ?  ? Unprioritized  ? Vertigo - Primary  ?  Sounds like she had some vertigo symptoms over the weekend. Resolved now. Monitor.  ?  ?  ? Hyperlipidemia  ?  Maintained on Zetia. Intolerant to statins. Fair control.  ?Lab Results  ?Component Value Date  ? CHOL 202 (H) 11/09/2020  ? HDL 64.00 11/09/2020  ? LDLCALC 112 (H) 11/09/2020  ? TRIG 129.0 11/09/2020  ? CHOLHDL 3 11/09/2020  ? ? ?  ?  ? HTN (hypertension)  ?  BP Readings from Last 3 Encounters:  ?05/10/21 129/77  ?11/09/20 (!) 144/70  ?07/31/19 134/80  ?BP at goal, continue losartan.  ?  ?  ? Relevant Orders  ? Basic metabolic panel  ? DM2 (diabetes mellitus, type 2) (HCC)  ?  Uncontrolled. Management per endocrinology.  ?  ?  ? Depression with anxiety  ?  Overall stable. Continue lexapro and prn xanax.  ?  ?  ? Relevant Medications  ? ALPRAZolam (XANAX) 0.5 MG tablet  ? ? ?I am having Dartha Lodge maintain her aspirin EC, Vitamin D3, Calcium Carbonate-Vitamin D, betamethasone dipropionate, Jardiance, insulin degludec, fluticasone, ezetimibe, losartan, escitalopram, and ALPRAZolam. ? ?Meds ordered this encounter  ?Medications  ? ALPRAZolam (XANAX) 0.5 MG tablet  ?  Sig: Take 1 tablet (0.5 mg total) by mouth 3 (three) times daily as needed.  ?  Dispense:  30 tablet  ?  Refill:  0  ?  Order Specific Question:   Supervising Provider  ?  Answer:   Danise Edge A [[id number]]  ? ?

## 2021-05-10 NOTE — Assessment & Plan Note (Signed)
Overall stable. Continue lexapro and prn xanax.  ?

## 2021-05-10 NOTE — Telephone Encounter (Signed)
Also, please call for DM eye exam:  : [phone] ?

## 2021-05-10 NOTE — Assessment & Plan Note (Signed)
BP Readings from Last 3 Encounters:  ?05/10/21 129/77  ?11/09/20 (!) 144/70  ?07/31/19 134/80  ? ?BP at goal, continue losartan.  ?

## 2021-05-10 NOTE — Assessment & Plan Note (Signed)
Uncontrolled. Management per endocrinology.  ?

## 2021-05-10 NOTE — Assessment & Plan Note (Addendum)
Maintained on Zetia. Intolerant to statins. Fair control.  ?Lab Results  ?Component Value Date  ? CHOL 202 (H) 11/09/2020  ? HDL 64.00 11/09/2020  ? LDLCALC 112 (H) 11/09/2020  ? TRIG 129.0 11/09/2020  ? CHOLHDL 3 11/09/2020  ? ? ?

## 2021-06-16 ENCOUNTER — Other Ambulatory Visit: Payer: Self-pay | Admitting: Family

## 2021-06-21 ENCOUNTER — Telehealth: Payer: Self-pay | Admitting: Family

## 2021-06-21 NOTE — Telephone Encounter (Signed)
Left message for patient to call back and schedule Medicare Annual Wellness Visit (AWV) in office.  ? ?If not able to come in office, please offer to do virtually or by telephone.  Left office number and my jabber [phone]. ? ?AWVI eligible as of 11/19/12 ? ?Please schedule at anytime with Nurse Health Advisor. ?  ?

## 2021-07-04 DIAGNOSIS — E1122 Type 2 diabetes mellitus with diabetic chronic kidney disease: Secondary | ICD-10-CM | POA: Diagnosis not present

## 2021-07-04 DIAGNOSIS — I129 Hypertensive chronic kidney disease with stage 1 through stage 4 chronic kidney disease, or unspecified chronic kidney disease: Secondary | ICD-10-CM | POA: Diagnosis not present

## 2021-07-04 DIAGNOSIS — E785 Hyperlipidemia, unspecified: Secondary | ICD-10-CM | POA: Diagnosis not present

## 2021-07-04 DIAGNOSIS — N182 Chronic kidney disease, stage 2 (mild): Secondary | ICD-10-CM | POA: Diagnosis not present

## 2021-07-04 DIAGNOSIS — E113291 Type 2 diabetes mellitus with mild nonproliferative diabetic retinopathy without macular edema, right eye: Secondary | ICD-10-CM | POA: Diagnosis not present

## 2021-07-04 DIAGNOSIS — Z7985 Long-term (current) use of injectable non-insulin antidiabetic drugs: Secondary | ICD-10-CM | POA: Diagnosis not present

## 2021-07-04 DIAGNOSIS — Z7984 Long term (current) use of oral hypoglycemic drugs: Secondary | ICD-10-CM | POA: Diagnosis not present

## 2021-07-21 ENCOUNTER — Other Ambulatory Visit: Payer: Self-pay | Admitting: Family

## 2021-10-10 DIAGNOSIS — E113291 Type 2 diabetes mellitus with mild nonproliferative diabetic retinopathy without macular edema, right eye: Secondary | ICD-10-CM | POA: Diagnosis not present

## 2021-10-10 DIAGNOSIS — E785 Hyperlipidemia, unspecified: Secondary | ICD-10-CM | POA: Diagnosis not present

## 2021-10-10 DIAGNOSIS — I10 Essential (primary) hypertension: Secondary | ICD-10-CM | POA: Diagnosis not present

## 2021-10-10 LAB — HEMOGLOBIN A1C: Hemoglobin A1C: 8.6

## 2021-10-30 ENCOUNTER — Other Ambulatory Visit: Payer: Self-pay | Admitting: Family

## 2021-11-17 ENCOUNTER — Ambulatory Visit: Payer: Medicare PPO | Admitting: Family

## 2021-11-17 ENCOUNTER — Telehealth: Payer: Self-pay | Admitting: Family

## 2021-11-17 VITALS — BP 126/79 | HR 80 | Temp 97.8°F | Resp 16 | Wt 187.0 lb

## 2021-11-17 DIAGNOSIS — Z1231 Encounter for screening mammogram for malignant neoplasm of breast: Secondary | ICD-10-CM | POA: Diagnosis not present

## 2021-11-17 DIAGNOSIS — R42 Dizziness and giddiness: Secondary | ICD-10-CM | POA: Diagnosis not present

## 2021-11-17 DIAGNOSIS — I10 Essential (primary) hypertension: Secondary | ICD-10-CM | POA: Diagnosis not present

## 2021-11-17 DIAGNOSIS — E119 Type 2 diabetes mellitus without complications: Secondary | ICD-10-CM

## 2021-11-17 DIAGNOSIS — E785 Hyperlipidemia, unspecified: Secondary | ICD-10-CM

## 2021-11-17 DIAGNOSIS — F418 Other specified anxiety disorders: Secondary | ICD-10-CM

## 2021-11-17 MED ORDER — TETANUS-DIPHTHERIA TOXOIDS TD 2-2 LF/0.5ML IM SUSP: 0.5000 mL | Freq: Once | INTRAMUSCULAR | 0 refills | Status: AC

## 2021-11-17 MED ORDER — LOSARTAN POTASSIUM 50 MG PO TABS: 50.0000 mg | ORAL_TABLET | Freq: Every day | ORAL | 1 refills | Status: DC

## 2021-11-17 NOTE — Assessment & Plan Note (Addendum)
Last A1C at endo was 8.6 on 10/10/21. Her Endocrinologist recommended that she titrate up her insulin until fasting glucose <125.  She misunderstood and was only taking the tresiba prn.  I advised her to continue daily dosing. Her most recent dose is 30 units SQ daily.

## 2021-11-17 NOTE — Assessment & Plan Note (Signed)
BP Readings from Last 3 Encounters:  11/17/21 126/79  05/10/21 129/77  11/09/20 (!) 144/70

## 2021-11-17 NOTE — Assessment & Plan Note (Signed)
Reports resolution of these symptoms.

## 2021-11-17 NOTE — Telephone Encounter (Signed)
Please call Dr. Otho Perl office, on [place] for DM eye exam

## 2021-11-17 NOTE — Assessment & Plan Note (Addendum)
Rare use of xanax. Fair control of her symptoms with lexapro. She is following with Reed Breech regularly for counseling. Controlled substance contract is updated.

## 2021-11-17 NOTE — Telephone Encounter (Signed)
Also, please abstract A1C from care everywhere 10/10/21 = 8.6.

## 2021-11-17 NOTE — Assessment & Plan Note (Addendum)
Lab Results  Component Value Date   CHOL 202 (H) 11/09/2020   HDL 64.00 11/09/2020   LDLCALC 112 (H) 11/09/2020   TRIG 129.0 11/09/2020   CHOLHDL 3 11/09/2020   LDL was still above goal in outside records.  Recommend that she continue zetia (she cannot tolerate statins).  Continue low fat diet.

## 2021-11-17 NOTE — Progress Notes (Signed)
Subjective:   By signing my name below, I, Wendy Guzman, attest that this documentation has been prepared under the direction and in the presence of Wendy Downs' Su[place]ivan, NP 11/17/2021 .     Patient ID: Wendy Guzman, female    DOB: [birth year], 76 y.o.   MRN: [id number]  Chief Complaint  Patient presents with   Diabetes    Managed by endo   Anxiety    Doing well on Xanax and lexapro    HPI Patient is in today for an office visit.  Refills: She is requesting a refill of 50 Mg of Losartan   Blood Pressure: As of today's visit, her blood pressure is normal. She is currently taking 50 Mg of Losartan  BP Readings from Last 3 Encounters:  11/17/21 126/79  05/10/21 129/77  11/09/20 (!) 144/70   Pulse Readings from Last 3 Encounters:  11/17/21 80  05/10/21 85  11/09/20 88   Kidney Function: Her kidney functions are normal. She is regularly following up with Dr. Allena Katz. Lab Results  Component Value Date   CREATININE 1.03 05/10/2021   BUN 25 (H) 05/10/2021   NA 138 05/10/2021   K 4.4 05/10/2021   CL 103 05/10/2021   CO2 28 05/10/2021   A1C: She is following up with Dr.Patel in regards to her sugars. She is titrating her Evaristo Bury mediation to 30 units but only takes the medication when her sugars are above 150 mg/dL Lab Results  Component Value Date   HGBA1C 10.6 04/03/2021   Cholesterol: Her cholesterol levels are elevating. She is currently taking 10 Mg of Zetia  Lab Results  Component Value Date   CHOL 202 (H) 11/09/2020   HDL 64.00 11/09/2020   LDLCALC 112 (H) 11/09/2020   TRIG 129.0 11/09/2020   CHOLHDL 3 11/09/2020   Anxiety: She is taking 0.5 Xanax once every couple of weeks.   Depression: She reports that her depression is relatively the same. She is currently taking 20 Mg of Lexapro and is regularly following up with a counselor.   Vertigo: Her vertigo symptoms are controlled  Immunizations: She reports that she has received an influenza vaccine.  She is due for a tetanus vaccine  Vision: She reports of an eye exam in the last year.   Mammogram: Last completed on 01/19/2019  Health Maintenance Due  Topic Date Due   Hepatitis C Screening  Never done   TETANUS/TDAP  Never done   OPHTHALMOLOGY EXAM  05/01/2019   COVID-19 Vaccine (4 - Pfizer risk series) 01/15/2020   MAMMOGRAM  01/04/2021   HEMOGLOBIN A1C  10/01/2021    Past Medical History:  Diagnosis Date   Colon polyps    Depression    Diabetes mellitus without complication (HCC)    Frequent headaches    History of chicken pox    Hyperlipidemia    Hypertension     Past Surgical History:  Procedure Laterality Date   TONSILLECTOMY  1950s   TUBAL LIGATION  1976    Family History  Problem Relation Age of Onset   COPD Mother    Aneurysm Mother        AAA   Cancer Father        lung   Diabetes Father    Diabetes Paternal Uncle    Hypertension Maternal Grandmother    Atrial fibrillation Brother    Diabetes Mellitus II Brother    Atrial fibrillation Brother    Diabetes Mellitus II Brother  Social History   Socioeconomic History   Marital status: Married    Spouse name: Not on file   Number of children: Not on file   Years of education: Not on file   Highest education level: Not on file  Occupational History   Not on file  Tobacco Use   Smoking status: Never   Smokeless tobacco: Never  Substance and Sexual Activity   Alcohol use: Yes    Alcohol/week: 2.0 - 4.0 standard drinks of alcohol    Types: 2 - 4 Glasses of wine per week   Drug use: No   Sexual activity: Not on file  Other Topics Concern   Not on file  Social History Narrative   Retired Runner, broadcasting/film/video and ten worked at the [employer]   Married   3 children, all are Designer, multimedia ([place]   Enjoys reading, Clinical cytogeneticist, quilting/crocheting   Social Determinants of Corporate investment banker Strain: Not on file  Food Insecurity: Not on file  Transportation Needs: Not on file  Physical  Activity: Not on file  Stress: Not on file  Social Connections: Not on file  Intimate Partner Violence: Not on file    Outpatient Medications Prior to Visit  Medication Sig Dispense Refill   ALPRAZolam (XANAX) 0.5 MG tablet Take 1 tablet (0.5 mg total) by mouth 3 (three) times daily as needed. 30 tablet 0   aspirin EC 81 MG tablet Take 1 tablet (81 mg total) by mouth daily.     betamethasone dipropionate (DIPROLENE) 0.05 % cream Apply topically 2 (two) times daily. 30 g 0   Calcium Carbonate-Vitamin D 600-400 MG-UNIT tablet Take 1 tablet by mouth 2 (two) times daily.     Cholecalciferol (VITAMIN D3) 10000 units TABS Take 3 tablets by mouth daily.     Dulaglutide (TRULICITY [place]) Inject into the skin. Once a week     escitalopram (LEXAPRO) 20 MG tablet TAKE 1 TABLET BY MOUTH EVERY DAY 90 tablet 1   ezetimibe (ZETIA) 10 MG tablet Take 1 tablet (10 mg total) by mouth daily. 90 tablet 1   fluticasone (FLONASE) 50 MCG/ACT nasal spray SPRAY 2 SPRAYS INTO EACH NOSTRIL EVERY DAY 48 mL 1   insulin degludec (TRESIBA) 100 UNIT/ML FlexTouch Pen Use as directed. Max daily dose is 50 units.     JARDIANCE 25 MG TABS tablet Take 25 mg by mouth daily.     losartan (COZAAR) 50 MG tablet TAKE 1 TABLET BY MOUTH EVERY DAY 90 tablet 1   No facility-administered medications prior to visit.    Allergies  Allergen Reactions   Ezetimibe-Simvastatin Other (See Comments)    Extreme muscle pain and weakness   Lovastatin Other (See Comments)    Extreme muscle pain and weakness   Rosuvastatin Other (See Comments)    Extreme muscle pain and weakness    ROS See HPI    Objective:    Physical Exam Constitutional:      General: She is not in acute distress.    Appearance: Normal appearance. She is not ill-appearing.  HENT:     Head: Normocephalic and atraumatic.     Right Ear: External ear normal.     Left Ear: External ear normal.  Eyes:     Extraocular Movements: Extraocular movements intact.      Pupils: Pupils are equal, round, and reactive to light.  Cardiovascular:     Rate and Rhythm: Normal rate and regular rhythm.     Pulses:  Dorsalis pedis pulses are 2+ on the right side and 2+ on the left side.       Posterior tibial pulses are 2+ on the right side and 2+ on the left side.     Heart sounds: Normal heart sounds. No murmur heard.    No gallop.  Pulmonary:     Effort: No respiratory distress.     Breath sounds: No wheezing or rales.  Skin:    General: Skin is warm and dry.  Neurological:     Mental Status: She is alert and oriented to person, place, and time.  Psychiatric:        Mood and Affect: Mood normal.        Behavior: Behavior normal.        Judgment: Judgment normal.     BP 126/79 (BP Location: Right Arm, Patient Position: Sitting, Cuff Size: Small)   Pulse 80   Temp 97.8 F (36.6 C) (Oral)   Resp 16   Wt 187 lb (84.8 kg)   SpO2 95%   BMI 30.18 kg/m  Wt Readings from Last 3 Encounters:  11/17/21 187 lb (84.8 kg)  05/10/21 176 lb (79.8 kg)  11/09/20 179 lb 9.6 oz (81.5 kg)   Diabetic Foot Exam - Simple   Simple Foot Form Diabetic Foot exam was performed with the following findings: Yes 11/17/2021  8:25 AM  Visual Inspection No deformities, no ulcerations, no other skin breakdown bilaterally: Yes Sensation Testing Intact to touch and monofilament testing bilaterally: Yes Pulse Check Posterior Tibialis and Dorsalis pulse intact bilaterally: Yes Comments        Assessment & Plan:   Problem List Items Addressed This Visit       Unprioritized   Vertigo    Reports resolution of these symptoms.        Hyperlipidemia    Lab Results  Component Value Date   CHOL 202 (H) 11/09/2020   HDL 64.00 11/09/2020   LDLCALC 112 (H) 11/09/2020   TRIG 129.0 11/09/2020   CHOLHDL 3 11/09/2020  LDL was still above goal in outside records.  Recommend that she continue zetia (she cannot tolerate statins).  Continue low fat diet.       Relevant  Medications   losartan (COZAAR) 50 MG tablet   HTN (hypertension)    BP Readings from Last 3 Encounters:  11/17/21 126/79  05/10/21 129/77  11/09/20 (!) 144/70        Relevant Medications   losartan (COZAAR) 50 MG tablet   DM2 (diabetes mellitus, type 2) (HCC)    Last A1C at endo was 8.6 on 10/10/21. Her Endocrinologist recommended that she titrate up her insulin until fasting glucose <125.  She misunderstood and was only taking the tresiba prn.  I advised her to continue daily dosing. Her most recent dose is 30 units SQ daily.       Relevant Medications   Dulaglutide (TRULICITY [place])   losartan (COZAAR) 50 MG tablet   Depression with anxiety    Rare use of xanax. Fair control of her symptoms with lexapro. She is following with Reed Breech regularly for counseling. Controlled substance contract is updated.       Relevant Orders   DRUG MONITORING, PANEL 8 WITH CONFIRMATION, URINE   Other Visit Diagnoses     Encounter for screening mammogram for malignant neoplasm of breast    -  Primary   Relevant Orders   MM 3D SCREEN BREAST BILATERAL      Meds  ordered this encounter  Medications   diptheria-tetanus toxoids (DECAVAC) 2-2 LF/0.5ML injection    Sig: Inject 0.5 mLs into the muscle once for 1 dose.    Dispense:  0.5 mL    Refill:  0    Order Specific Question:   Supervising Provider    Answer:   Danise Edge A [[id number]]   losartan (COZAAR) 50 MG tablet    Sig: Take 1 tablet (50 mg total) by mouth daily.    Dispense:  90 tablet    Refill:  1    Order Specific Question:   Supervising Provider    Answer:   Danise Edge A [[id number]]    I, Lemont Fillers, NP, personally preformed the services described in this documentation.  All medical record entries made by the scribe were at my direction and in my presence.  I have reviewed the chart and discharge instructions (if applicable) and agree that the record reflects my personal performance and is accurate and complete.  11/17/2021    I,Amber Collins,acting as a scribe for Lemont Fillers, NP.,have documented all relevant documentation on the behalf of Lemont Fillers, NP,as directed by  Lemont Fillers, NP while in the presence of Lemont Fillers, NP.    Lemont Fillers, NP

## 2021-11-17 NOTE — Telephone Encounter (Signed)
Results abstracted, records release faxed ror eye exam

## 2021-11-19 LAB — DRUG MONITORING, PANEL 8 WITH CONFIRMATION, URINE
6 Acetylmorphine: NEGATIVE ng/mL (ref <10)
Alcohol Metabolites: POSITIVE ng/mL — AB (ref <500)
Amphetamines: NEGATIVE ng/mL (ref <500)
Benzodiazepines: NEGATIVE ng/mL (ref <100)
Buprenorphine, Urine: NEGATIVE ng/mL (ref <5)
Cocaine Metabolite: NEGATIVE ng/mL (ref <150)
Creatinine: 92.5 mg/dL (ref >=20.0)
Ethyl Glucuronide (ETG): >10000 ng/mL — ABNORMAL HIGH (ref <500)
Ethyl Sulfate (ETS): 5301 ng/mL — ABNORMAL HIGH (ref <100)
MDMA: NEGATIVE ng/mL (ref <500)
Marijuana Metabolite: NEGATIVE ng/mL (ref <20)
Opiates: NEGATIVE ng/mL (ref <100)
Oxidant: NEGATIVE ug/mL (ref <200)
Oxycodone: NEGATIVE ng/mL (ref <100)
pH: 5.5 (ref 4.5–9.0)

## 2021-11-19 LAB — DM TEMPLATE

## 2021-11-20 ENCOUNTER — Ambulatory Visit (HOSPITAL_BASED_OUTPATIENT_CLINIC_OR_DEPARTMENT_OTHER)
Admission: RE | Admit: 2021-11-20 | Discharge: 2021-11-20 | Disposition: A | Discharge status: Home / Self Care | Payer: Medicare PPO | Source: Ambulatory Visit | Attending: Family | Admitting: Family

## 2021-11-20 ENCOUNTER — Encounter (HOSPITAL_BASED_OUTPATIENT_CLINIC_OR_DEPARTMENT_OTHER): Payer: Self-pay

## 2021-11-20 DIAGNOSIS — Z1231 Encounter for screening mammogram for malignant neoplasm of breast: Secondary | ICD-10-CM | POA: Diagnosis not present

## 2021-11-22 ENCOUNTER — Encounter: Payer: Self-pay | Admitting: *Deleted

## 2021-12-21 ENCOUNTER — Telehealth: Payer: Self-pay | Admitting: *Deleted

## 2021-12-21 NOTE — Chronic Care Management (AMB) (Signed)
  Care Coordination  Outreach Note  12/21/2021 Name: Wendy Guzman MRN: [id number] DOB: 15-Feb-1946   Care Coordination Outreach Attempts: An unsuccessful telephone outreach was attempted today to offer the patient information about available care coordination services as a benefit of their health plan.   Follow Up Plan:  Additional outreach attempts will be made to offer the patient care coordination information and services.   Encounter Outcome:  No Answer  Julian Hy, [place] Direct Dial: [phone]

## 2022-01-27 ENCOUNTER — Other Ambulatory Visit: Payer: Self-pay | Admitting: Family

## 2022-02-01 NOTE — Progress Notes (Signed)
  Care Coordination  Outreach Note  02/01/2022 Name: Wendy Guzman MRN: [id number] DOB: [birth year]   Care Coordination Outreach Attempts: A third unsuccessful outreach was attempted today to offer the patient with information about available care coordination services as a benefit of their health plan.   Follow Up Plan:  No further outreach attempts will be made at this time. We have been unable to contact the patient to offer or enroll patient in care coordination services  Encounter Outcome:  No Answer  Burman Nieves, [facility] Care Coordination Care Guide Direct Dial: [phone]

## 2022-02-27 DIAGNOSIS — Z7984 Long term (current) use of oral hypoglycemic drugs: Secondary | ICD-10-CM | POA: Diagnosis not present

## 2022-02-27 DIAGNOSIS — E785 Hyperlipidemia, unspecified: Secondary | ICD-10-CM | POA: Diagnosis not present

## 2022-02-27 DIAGNOSIS — Z794 Long term (current) use of insulin: Secondary | ICD-10-CM | POA: Diagnosis not present

## 2022-02-27 DIAGNOSIS — I129 Hypertensive chronic kidney disease with stage 1 through stage 4 chronic kidney disease, or unspecified chronic kidney disease: Secondary | ICD-10-CM | POA: Diagnosis not present

## 2022-02-27 DIAGNOSIS — E113291 Type 2 diabetes mellitus with mild nonproliferative diabetic retinopathy without macular edema, right eye: Secondary | ICD-10-CM | POA: Diagnosis not present

## 2022-02-27 DIAGNOSIS — E1122 Type 2 diabetes mellitus with diabetic chronic kidney disease: Secondary | ICD-10-CM | POA: Diagnosis not present

## 2022-02-27 DIAGNOSIS — Z7985 Long-term (current) use of injectable non-insulin antidiabetic drugs: Secondary | ICD-10-CM | POA: Diagnosis not present

## 2022-02-27 DIAGNOSIS — N182 Chronic kidney disease, stage 2 (mild): Secondary | ICD-10-CM | POA: Diagnosis not present

## 2022-04-27 ENCOUNTER — Other Ambulatory Visit: Payer: Self-pay | Admitting: Family

## 2022-05-01 ENCOUNTER — Other Ambulatory Visit: Payer: Self-pay | Admitting: Family

## 2022-05-11 ENCOUNTER — Encounter: Payer: Self-pay | Admitting: Family

## 2022-05-11 ENCOUNTER — Telehealth: Payer: Self-pay | Admitting: Family

## 2022-05-11 ENCOUNTER — Ambulatory Visit: Payer: Medicare PPO | Admitting: Family

## 2022-05-11 VITALS — BP 128/74 | HR 78 | Temp 97.7°F | Resp 16 | Ht 66.0 in | Wt 196.2 lb

## 2022-05-11 DIAGNOSIS — E113291 Type 2 diabetes mellitus with mild nonproliferative diabetic retinopathy without macular edema, right eye: Secondary | ICD-10-CM | POA: Diagnosis not present

## 2022-05-11 DIAGNOSIS — R42 Dizziness and giddiness: Secondary | ICD-10-CM

## 2022-05-11 DIAGNOSIS — F418 Other specified anxiety disorders: Secondary | ICD-10-CM

## 2022-05-11 DIAGNOSIS — I10 Essential (primary) hypertension: Secondary | ICD-10-CM | POA: Diagnosis not present

## 2022-05-11 DIAGNOSIS — E119 Type 2 diabetes mellitus without complications: Secondary | ICD-10-CM

## 2022-05-11 DIAGNOSIS — E785 Hyperlipidemia, unspecified: Secondary | ICD-10-CM | POA: Diagnosis not present

## 2022-05-11 LAB — COMPREHENSIVE METABOLIC PANEL
ALT: 23 U/L (ref 0–35)
AST: 24 U/L (ref 0–37)
Albumin: 4.3 g/dL (ref 3.5–5.2)
Alkaline Phosphatase: 80 U/L (ref 39–117)
BUN: 20 mg/dL (ref 6–23)
CO2: 28 mEq/L (ref 19–32)
Calcium: 9.9 mg/dL (ref 8.4–10.5)
Chloride: 106 mEq/L (ref 96–112)
Creatinine, Ser: 0.99 mg/dL (ref 0.40–1.20)
GFR: 55.82 mL/min — ABNORMAL LOW (ref >60.00)
Glucose, Bld: 106 mg/dL — ABNORMAL HIGH (ref 70–99)
Potassium: 4.4 mEq/L (ref 3.5–5.1)
Sodium: 141 mEq/L (ref 135–145)
Total Bilirubin: 0.7 mg/dL (ref 0.2–1.2)
Total Protein: 6.6 g/dL (ref 6.0–8.3)

## 2022-05-11 LAB — LIPID PANEL
Cholesterol: 192 mg/dL (ref 0–200)
HDL: 70.4 mg/dL (ref >39.00)
LDL Cholesterol: 108 mg/dL — ABNORMAL HIGH (ref 0–99)
NonHDL: 121.66
Total CHOL/HDL Ratio: 3
Triglycerides: 70 mg/dL (ref 0.0–149.0)
VLDL: 14 mg/dL (ref 0.0–40.0)

## 2022-05-11 LAB — MICROALBUMIN / CREATININE URINE RATIO
Creatinine,U: 110.4 mg/dL
Microalb Creat Ratio: 1.2 mg/g (ref 0.0–30.0)
Microalb, Ur: 1.3 mg/dL (ref 0.0–1.9)

## 2022-05-11 MED ORDER — ALPRAZOLAM 0.5 MG PO TABS: 0.5000 mg | ORAL_TABLET | Freq: Three times a day (TID) | ORAL | 0 refills | Status: AC | PRN

## 2022-05-11 NOTE — Telephone Encounter (Signed)
Request sent through Epic.

## 2022-05-11 NOTE — Progress Notes (Signed)
Subjective:   By signing my name below, I, Wendy Guzman, attest that this documentation has been prepared under the direction and in the presence of Debbrah Alar, NP. 05/11/2022.   Patient ID: Wendy Guzman, female    DOB: [birth year], 77 y.o.   MRN: [id number]  Chief Complaint  Patient presents with   Follow-up    Follow up    HPI Patient is in today for an office visit.  Refills:  She is requesting a refill of her alprazolam 0.5 mg. Usually she takes this as needed.  Mood:  Overall she is doing well on Lexapro. She has her good and bad days.  Vertigo: No recent vertigo symptoms.  Blood pressure:  Her blood pressure is stable today on Losartan. BP Readings from Last 3 Encounters:  05/11/22 128/74  11/17/21 126/79  05/10/21 129/77   Diabetes:  Continues to work with Dr. Posey Pronto, endocrinology, regarding her A1C. Lab Results  Component Value Date   HGBA1C 8.6 10/10/2021   Cholesterol: We will check lipids today. She continues to be compliant with Zetia.  Vision:  She notes that it is time for another eye exam. She usually completes these annually.  Immunizations: She [facility]  Mammogram:  UTD.  Past Medical History:  Diagnosis Date   Colon polyps    Depression    Diabetes mellitus without complication (HCC)    Frequent headaches    History of chicken pox    Hyperlipidemia    Hypertension     Past Surgical History:  Procedure Laterality Date   TONSILLECTOMY  63s   TUBAL LIGATION  1976    Family History  Problem Relation Age of Onset   COPD Mother    Aneurysm Mother        AAA   Cancer Father        lung   Diabetes Father    Diabetes Paternal Uncle    Hypertension Maternal Grandmother    Atrial fibrillation Brother    Diabetes Mellitus II Brother    Atrial fibrillation Brother    Diabetes Mellitus II Brother     Social History   Socioeconomic History   Marital status: Married     Spouse name: Not on file   Number of children: Not on file   Years of education: Not on file   Highest education level: Not on file  Occupational History   Not on file  Tobacco Use   Smoking status: Never   Smokeless tobacco: Never  Substance and Sexual Activity   Alcohol use: Yes    Alcohol/week: 2.0 - 4.0 standard drinks of alcohol    Types: 2 - 4 Glasses of wine per week   Drug use: No   Sexual activity: Not on file  Other Topics Concern   Not on file  Social History Narrative   Retired Pharmacist, hospital and ten worked at the [employer]   Married   3 children, all are Radiation protection practitioner ([place])   Enjoys reading, Barrister's clerk, quilting/crocheting   Social Determinants of Radio broadcast assistant Strain: Not on file  Food Insecurity: Not on file  Transportation Needs: Not on file  Physical Activity: Not on file  Stress: Not on file  Social Connections: Not on file  Intimate Partner Violence: Not on file    Outpatient Medications Prior to Visit  Medication Sig Dispense Refill   aspirin EC 81 MG tablet Take 1 tablet (81 mg total) by mouth  daily.     betamethasone dipropionate (DIPROLENE) 0.05 % cream Apply topically 2 (two) times daily. 30 g 0   Calcium Carbonate-Vitamin D 600-400 MG-UNIT tablet Take 1 tablet by mouth 2 (two) times daily.     Cholecalciferol (VITAMIN D3) 10000 units TABS Take 3 tablets by mouth daily.     Dulaglutide (TRULICITY [place]) Inject into the skin. Once a week     escitalopram (LEXAPRO) 20 MG tablet TAKE 1 TABLET BY MOUTH EVERY DAY 90 tablet 0   ezetimibe (ZETIA) 10 MG tablet Take 1 tablet (10 mg total) by mouth daily. 90 tablet 1   fluticasone (FLONASE) 50 MCG/ACT nasal spray SPRAY 2 SPRAYS INTO EACH NOSTRIL EVERY DAY 48 mL 1   insulin degludec (TRESIBA) 100 UNIT/ML FlexTouch Pen Use as directed. Max daily dose is 50 units.     JARDIANCE 25 MG TABS tablet Take 25 mg by mouth daily.     losartan (COZAAR) 50 MG tablet Take 1 tablet (50 mg total) by mouth daily. 90  tablet 1   ALPRAZolam (XANAX) 0.5 MG tablet Take 1 tablet (0.5 mg total) by mouth 3 (three) times daily as needed. 30 tablet 0   No facility-administered medications prior to visit.    Allergies  Allergen Reactions   Ezetimibe-Simvastatin Other (See Comments)    Extreme muscle pain and weakness   Lovastatin Other (See Comments)    Extreme muscle pain and weakness   Rosuvastatin Other (See Comments)    Extreme muscle pain and weakness    ROS See HPI.     Objective:    Physical Exam Constitutional:      Appearance: Normal appearance.  HENT:     Head: Normocephalic and atraumatic.     Right Ear: Tympanic membrane, ear canal and external ear normal.     Left Ear: Tympanic membrane, ear canal and external ear normal.  Eyes:     Extraocular Movements: Extraocular movements intact.     Pupils: Pupils are equal, round, and reactive to light.  Cardiovascular:     Rate and Rhythm: Normal rate and regular rhythm.     Heart sounds: Normal heart sounds. No murmur heard.    No gallop.  Pulmonary:     Effort: Pulmonary effort is normal. No respiratory distress.     Breath sounds: Normal breath sounds. No wheezing or rales.  Skin:    General: Skin is warm and dry.  Neurological:     General: No focal deficit present.     Mental Status: She is alert and oriented to person, place, and time.  Psychiatric:        Mood and Affect: Mood normal.        Behavior: Behavior normal.     BP 128/74 (BP Location: Right Arm, Patient Position: Sitting)   Pulse 78   Temp 97.7 F (36.5 C) (Oral)   Resp 16   Ht 5\' 6"  (1.676 m)   Wt 196 lb 3.2 oz (89 kg)   SpO2 97%   BMI 31.67 kg/m  Wt Readings from Last 3 Encounters:  05/11/22 196 lb 3.2 oz (89 kg)  11/17/21 187 lb (84.8 kg)  05/10/21 176 lb (79.8 kg)        Assessment & Plan:   Problem List Items Addressed This Visit       Unprioritized   Vertigo    No recent vertigo symptoms.        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  Above goal but improving. She reports that she is working hard on her diet and exercise.       Hyperlipidemia    Lab Results  Component Value Date   CHOL 202 (H) 11/09/2020   HDL 64.00 11/09/2020   LDLCALC 112 (H) 11/09/2020   TRIG 129.0 11/09/2020   CHOLHDL 3 11/09/2020  Statin intolerant, continues zetia.       Relevant Orders   Lipid panel   Comp Met (CMET)   HTN (hypertension) - Primary    BP at goal, continue losartan.        Depression with anxiety    Overall stable.  Continue lexapro and prn xanax. She is up to date on her controlled substanc contract.       Relevant Medications   ALPRAZolam (XANAX) 0.5 MG tablet     Meds ordered this encounter  Medications   ALPRAZolam (XANAX) 0.5 MG tablet    Sig: Take 1 tablet (0.5 mg total) by mouth 3 (three) times daily as needed.    Dispense:  30 tablet    Refill:  0    Order Specific Question:   Supervising Provider    Answer:   Penni Homans A [[id number]]    I, Wendy Pear, NP, personally preformed the services described in this documentation.  All medical record entries made by the scribe were at my direction and in my presence.  I have reviewed the chart and discharge instructions (if applicable) and agree that the record reflects my personal performance and is accurate and complete. 05/11/2022.  I,Mathew Stumpf,acting as a Education administrator for [employer], NP.,have documented all relevant documentation on the behalf of Wendy Pear, NP,as directed by  Wendy Pear, NP while in the presence of Wendy Pear, NP.   Wendy Pear, NP

## 2022-05-11 NOTE — Telephone Encounter (Signed)
Dr. Orvan Seen on [place]- eye doctor. Can you please request last DM eye exam?

## 2022-05-11 NOTE — Assessment & Plan Note (Addendum)
Overall stable.  Continue lexapro and prn xanax. She is up to date on her controlled substanc contract.

## 2022-05-11 NOTE — Assessment & Plan Note (Addendum)
Last A1C was 7.8 in January at [place].  Above goal but improving. She reports that she is working hard on her diet and exercise.

## 2022-05-11 NOTE — Assessment & Plan Note (Signed)
No recent vertigo symptoms.

## 2022-05-11 NOTE — Assessment & Plan Note (Signed)
Lab Results  Component Value Date   CHOL 202 (H) 11/09/2020   HDL 64.00 11/09/2020   LDLCALC 112 (H) 11/09/2020   TRIG 129.0 11/09/2020   CHOLHDL 3 11/09/2020   Statin intolerant, continues zetia.

## 2022-05-11 NOTE — Assessment & Plan Note (Signed)
BP at goal, continue losartan

## 2022-05-14 LAB — HM DIABETES EYE EXAM

## 2022-05-16 ENCOUNTER — Other Ambulatory Visit: Payer: Self-pay

## 2022-05-18 ENCOUNTER — Ambulatory Visit: Payer: Medicare PPO | Admitting: Family

## 2022-06-28 DIAGNOSIS — E782 Mixed hyperlipidemia: Secondary | ICD-10-CM | POA: Diagnosis not present

## 2022-06-28 DIAGNOSIS — E113291 Type 2 diabetes mellitus with mild nonproliferative diabetic retinopathy without macular edema, right eye: Secondary | ICD-10-CM | POA: Diagnosis not present

## 2022-06-28 DIAGNOSIS — I10 Essential (primary) hypertension: Secondary | ICD-10-CM | POA: Diagnosis not present

## 2022-06-28 DIAGNOSIS — E1129 Type 2 diabetes mellitus with other diabetic kidney complication: Secondary | ICD-10-CM | POA: Diagnosis not present

## 2022-06-28 DIAGNOSIS — Z794 Long term (current) use of insulin: Secondary | ICD-10-CM | POA: Diagnosis not present

## 2022-06-28 DIAGNOSIS — Z7985 Long-term (current) use of injectable non-insulin antidiabetic drugs: Secondary | ICD-10-CM | POA: Diagnosis not present

## 2022-06-28 DIAGNOSIS — Z7984 Long term (current) use of oral hypoglycemic drugs: Secondary | ICD-10-CM | POA: Diagnosis not present

## 2022-06-28 DIAGNOSIS — R809 Proteinuria, unspecified: Secondary | ICD-10-CM | POA: Diagnosis not present

## 2022-08-03 ENCOUNTER — Other Ambulatory Visit: Payer: Self-pay | Admitting: Family

## 2022-08-07 LAB — HM DIABETES EYE EXAM

## 2022-08-08 ENCOUNTER — Encounter: Payer: Self-pay | Admitting: Family

## 2022-09-07 ENCOUNTER — Telehealth: Payer: Self-pay | Admitting: Family

## 2022-09-07 ENCOUNTER — Other Ambulatory Visit: Payer: Self-pay

## 2022-09-07 MED ORDER — ESCITALOPRAM OXALATE 20 MG PO TABS: 20.0000 mg | ORAL_TABLET | Freq: Every day | ORAL | 0 refills | Status: DC

## 2022-09-07 NOTE — Telephone Encounter (Signed)
Rx sent 

## 2022-09-07 NOTE — Telephone Encounter (Signed)
Medication: escitalopram (LEXAPRO) 20 MG tablet  Has the patient contacted their pharmacy? No.   Preferred Pharmacy:   [id number] - [place], [place] - [address], [place] [address] Phone: [phone]  Fax: [phone]

## 2022-10-04 ENCOUNTER — Encounter (INDEPENDENT_AMBULATORY_CARE_PROVIDER_SITE_OTHER): Payer: Self-pay

## 2022-10-29 DIAGNOSIS — E1165 Type 2 diabetes mellitus with hyperglycemia: Secondary | ICD-10-CM | POA: Diagnosis not present

## 2022-10-29 DIAGNOSIS — Z7985 Long-term (current) use of injectable non-insulin antidiabetic drugs: Secondary | ICD-10-CM | POA: Diagnosis not present

## 2022-10-29 DIAGNOSIS — E1122 Type 2 diabetes mellitus with diabetic chronic kidney disease: Secondary | ICD-10-CM | POA: Diagnosis not present

## 2022-10-29 DIAGNOSIS — I10 Essential (primary) hypertension: Secondary | ICD-10-CM | POA: Diagnosis not present

## 2022-10-29 DIAGNOSIS — E785 Hyperlipidemia, unspecified: Secondary | ICD-10-CM | POA: Diagnosis not present

## 2022-10-29 DIAGNOSIS — Z794 Long term (current) use of insulin: Secondary | ICD-10-CM | POA: Diagnosis not present

## 2022-10-29 DIAGNOSIS — N182 Chronic kidney disease, stage 2 (mild): Secondary | ICD-10-CM | POA: Diagnosis not present

## 2022-10-29 DIAGNOSIS — Z7984 Long term (current) use of oral hypoglycemic drugs: Secondary | ICD-10-CM | POA: Diagnosis not present

## 2022-10-29 LAB — HEMOGLOBIN A1C: Hemoglobin A1C: 6.6

## 2022-11-12 ENCOUNTER — Ambulatory Visit: Payer: Medicare PPO | Admitting: Family

## 2022-11-12 VITALS — BP 142/73 | HR 87 | Temp 98.1°F | Resp 16 | Wt 199.0 lb

## 2022-11-12 DIAGNOSIS — E785 Hyperlipidemia, unspecified: Secondary | ICD-10-CM

## 2022-11-12 DIAGNOSIS — I10 Essential (primary) hypertension: Secondary | ICD-10-CM | POA: Diagnosis not present

## 2022-11-12 DIAGNOSIS — R42 Dizziness and giddiness: Secondary | ICD-10-CM | POA: Diagnosis not present

## 2022-11-12 DIAGNOSIS — Z780 Asymptomatic menopausal state: Secondary | ICD-10-CM | POA: Diagnosis not present

## 2022-11-12 DIAGNOSIS — Z7984 Long term (current) use of oral hypoglycemic drugs: Secondary | ICD-10-CM

## 2022-11-12 DIAGNOSIS — E113291 Type 2 diabetes mellitus with mild nonproliferative diabetic retinopathy without macular edema, right eye: Secondary | ICD-10-CM | POA: Diagnosis not present

## 2022-11-12 DIAGNOSIS — M8580 Other specified disorders of bone density and structure, unspecified site: Secondary | ICD-10-CM | POA: Diagnosis not present

## 2022-11-12 LAB — COMPREHENSIVE METABOLIC PANEL
ALT: 25 U/L (ref 0–35)
AST: 22 U/L (ref 0–37)
Albumin: 4.1 g/dL (ref 3.5–5.2)
Alkaline Phosphatase: 85 U/L (ref 39–117)
BUN: 18 mg/dL (ref 6–23)
CO2: 24 mEq/L (ref 19–32)
Calcium: 9.9 mg/dL (ref 8.4–10.5)
Chloride: 110 mEq/L (ref 96–112)
Creatinine, Ser: 0.81 mg/dL (ref 0.40–1.20)
GFR: 70.76 mL/min (ref >60.00)
Glucose, Bld: 81 mg/dL (ref 70–99)
Potassium: 4.2 mEq/L (ref 3.5–5.1)
Sodium: 142 mEq/L (ref 135–145)
Total Bilirubin: 0.8 mg/dL (ref 0.2–1.2)
Total Protein: 6.5 g/dL (ref 6.0–8.3)

## 2022-11-12 MED ORDER — ESCITALOPRAM OXALATE 20 MG PO TABS: 20.0000 mg | ORAL_TABLET | Freq: Every day | ORAL | 1 refills | Status: DC

## 2022-11-12 MED ORDER — LOSARTAN POTASSIUM 50 MG PO TABS: 75.0000 mg | ORAL_TABLET | Freq: Every day | ORAL | 1 refills | Status: DC

## 2022-11-12 NOTE — Assessment & Plan Note (Signed)
  Last bone density scan in 2019. -Order bone density scan.

## 2022-11-12 NOTE — Assessment & Plan Note (Signed)
Stable. No episodes noted at this time

## 2022-11-12 NOTE — Assessment & Plan Note (Addendum)
A1C on 9/9 was 6.6. Continue current medications as ordered and follow up as scheduled with endocrinology.

## 2022-11-12 NOTE — Patient Instructions (Signed)
VISIT SUMMARY:  During your recent visit, we discussed your diabetes, hypertension, anxiety, and insomnia. Your diabetes is well controlled, and your cholesterol levels are satisfactory. However, your blood pressure is slightly elevated, and you're having difficulty falling asleep. We also discussed your general health maintenance, including vaccinations and follow-up visits.  YOUR PLAN:  -DIABETES: Your diabetes is well controlled with your current medications (Jardiance, Trulicity, and [place]). Continue taking these as prescribed.  -CHOLESTEROL: You're taking Zetia for cholesterol control, as statins cause you side effects. Your last cholesterol check was satisfactory, so continue taking Zetia as prescribed.  -BONE HEALTH: We haven't checked your bone density since 2019, so I've ordered a new bone density scan to check for osteoporosis.  -HIGH BLOOD PRESSURE: Your blood pressure is slightly high, so I've increased your Losartan dosage to 75mg  daily. We'll check your blood pressure and labs in 2 weeks to see how you're responding to the increased dosage.  -ANXIETY AND INSOMNIA: You're having trouble falling asleep, which may be related to your anxiety. I've advised you on some sleep hygiene measures, like limiting caffeine intake after 3pm, setting a later bedtime, and reading before bed. Continue taking Xanax as needed for anxiety.  -GENERAL HEALTH MAINTENANCE: I've advised you [facility], and to get a tetanus shot. We've scheduled a follow-up visit in 6 months, and a nurse visit in 2 weeks to check your blood pressure and labs.  INSTRUCTIONS:  Please remember to get your flu shot, COVID booster, and tetanus shot. Also, remember to come in for your nurse visit in 2 weeks, and your follow-up visit in 6 months. If you have any questions or concerns, don't hesitate to contact us.

## 2022-11-12 NOTE — Progress Notes (Signed)
Subjective:     Patient ID: Wendy Guzman, female    DOB: 1945/12/19, 77 y.o.   MRN: [id number]  Chief Complaint  Patient presents with   Diabetes    Here for follow up   Hypertension    Here for follow up    Diabetes  Hypertension    Discussed the use of AI scribe software for clinical note transcription with the patient, who gave verbal consent to proceed.  History of Present Illness   The patient, with a history of diabetes, hypertension, and anxiety, presents for a routine follow-up. She reports excellent control of her diabetes, as evidenced by her recent A1c of 6.6. She continues to follow with endocrinology. She is currently on Jardiance, Trulicity, and Tresiba (30 units) for diabetes management. She also takes Zetia 10 for cholesterol control, as statins cause her intolerable side effects.  She also reports difficulty falling asleep at night, despite feeling tired after supper. She has tried taking Benadryl to aid sleep but is concerned about potential side effects. She has a prescription for Xanax, which she takes as needed for anxiety.  She also reports some joint pain, which she attributes to arthritis.      Lab Results  Component Value Date   CHOL 192 05/11/2022   HDL 70.40 05/11/2022   LDLCALC 108 (H) 05/11/2022   TRIG 70.0 05/11/2022   CHOLHDL 3 05/11/2022       Health Maintenance Due  Topic Date Due   DTaP/Tdap/Td (1 - Tdap) Never done   Medicare Annual Wellness (AWV)  08/07/2016   HEMOGLOBIN A1C  04/12/2022   COVID-19 Vaccine (4 - 2023-24 season) 10/21/2022    Past Medical History:  Diagnosis Date   Colon polyps    Depression    Diabetes mellitus without complication (HCC)    Frequent headaches    History of chicken pox    Hyperlipidemia    Hypertension     Past Surgical History:  Procedure Laterality Date   TONSILLECTOMY  61s   TUBAL LIGATION  1976    Family History  Problem Relation Age of Onset   COPD Mother     Aneurysm Mother        AAA   Cancer Father        lung   Diabetes Father    Diabetes Paternal Uncle    Hypertension Maternal Grandmother    Atrial fibrillation Brother    Diabetes Mellitus II Brother    Atrial fibrillation Brother    Diabetes Mellitus II Brother     Social History   Socioeconomic History   Marital status: Married    Spouse name: Not on file   Number of children: Not on file   Years of education: Not on file   Highest education level: Not on file  Occupational History   Not on file  Tobacco Use   Smoking status: Never   Smokeless tobacco: Never  Substance and Sexual Activity   Alcohol use: Yes    Alcohol/week: 2.0 - 4.0 standard drinks of alcohol    Types: 2 - 4 Glasses of wine per week   Drug use: No   Sexual activity: Not on file  Other Topics Concern   Not on file  Social History Narrative   Retired Runner, broadcasting/film/video and ten worked at the [employer]   Married   3 children, all are Designer, multimedia ([place]   Enjoys reading, Clinical cytogeneticist, quilting/crocheting   Social Determinants of Corporate investment banker Strain:  Not on file  Food Insecurity: Not on file  Transportation Needs: Not on file  Physical Activity: Not on file  Stress: Not on file  Social Connections: Not on file  Intimate Partner Violence: Not on file    Outpatient Medications Prior to Visit  Medication Sig Dispense Refill   ALPRAZolam (XANAX) 0.5 MG tablet Take 1 tablet (0.5 mg total) by mouth 3 (three) times daily as needed. 30 tablet 0   aspirin EC 81 MG tablet Take 1 tablet (81 mg total) by mouth daily.     betamethasone dipropionate (DIPROLENE) 0.05 % cream Apply topically 2 (two) times daily. 30 g 0   Calcium Carbonate-Vitamin D 600-400 MG-UNIT tablet Take 1 tablet by mouth 2 (two) times daily.     Cholecalciferol (VITAMIN D3) 10000 units TABS Take 3 tablets by mouth daily.     Dulaglutide (TRULICITY [place]) Inject into the skin. Once a week     ezetimibe (ZETIA) 10 MG tablet Take 1  tablet (10 mg total) by mouth daily. 90 tablet 1   fluticasone (FLONASE) 50 MCG/ACT nasal spray SPRAY 2 SPRAYS INTO EACH NOSTRIL EVERY DAY 48 mL 1   insulin degludec (TRESIBA) 100 UNIT/ML FlexTouch Pen Use as directed. Max daily dose is 50 units.     JARDIANCE 25 MG TABS tablet Take 25 mg by mouth daily.     escitalopram (LEXAPRO) 20 MG tablet Take 1 tablet (20 mg total) by mouth daily. 90 tablet 0   losartan (COZAAR) 50 MG tablet Take 1 tablet (50 mg total) by mouth daily. 90 tablet 1   No facility-administered medications prior to visit.    Allergies  Allergen Reactions   Ezetimibe-Simvastatin Other (See Comments)    Extreme muscle pain and weakness   Lovastatin Other (See Comments)    Extreme muscle pain and weakness   Rosuvastatin Other (See Comments)    Extreme muscle pain and weakness    ROS See HPI    Objective:    Physical Exam Constitutional:      General: She is not in acute distress.    Appearance: Normal appearance. She is well-developed.  HENT:     Head: Normocephalic and atraumatic.     Right Ear: External ear normal.     Left Ear: External ear normal.  Eyes:     General: No scleral icterus. Neck:     Thyroid: No thyromegaly.  Cardiovascular:     Rate and Rhythm: Normal rate and regular rhythm.     Heart sounds: Normal heart sounds. No murmur heard. Pulmonary:     Effort: Pulmonary effort is normal. No respiratory distress.     Breath sounds: Normal breath sounds. No wheezing.  Musculoskeletal:     Cervical back: Neck supple.  Skin:    General: Skin is warm and dry.  Neurological:     Mental Status: She is alert and oriented to person, place, and time.  Psychiatric:        Mood and Affect: Mood normal.        Behavior: Behavior normal.        Thought Content: Thought content normal.        Judgment: Judgment normal.      BP (!) 142/73 (BP Location: Right Arm, Patient Position: Sitting, Cuff Size: Large)   Pulse 87   Temp 98.1 F (36.7 C)  (Oral)   Resp 16   Wt 199 lb (90.3 kg)   SpO2 94%   BMI 32.12 kg/m  Wt  Readings from Last 3 Encounters:  11/12/22 199 lb (90.3 kg)  05/11/22 196 lb 3.2 oz (89 kg)  11/17/21 187 lb (84.8 kg)       Assessment & Plan:   Problem List Items Addressed This Visit       Unprioritized   Vertigo - Primary    Stable. 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 Continue current medications as ordered and follow up as scheduled with endocrinology.       Relevant Medications   losartan (COZAAR) 50 MG tablet   Other Relevant Orders   Comp Met (CMET)   Osteopenia     Last bone density scan in 2019. -Order bone density scan.      Hyperlipidemia    Lab Results  Component Value Date   CHOL 192 05/11/2022   HDL 70.40 05/11/2022   LDLCALC 108 (H) 05/11/2022   TRIG 70.0 05/11/2022   CHOLHDL 3 05/11/2022   Last Lipid profile looks stable on zetia, she is statin intolerant. Monitor.       Relevant Medications   losartan (COZAAR) 50 MG tablet   HTN (hypertension)    Above goal, will increase losartan to 75 mg once daily.      Relevant Medications   losartan (COZAAR) 50 MG tablet   Other Relevant Orders   Basic metabolic panel   Other Visit Diagnoses     Postmenopausal estrogen deficiency       Relevant Orders   DG Bone Density       I have changed Wendy Guzman's losartan. I am also having her maintain her aspirin EC, Vitamin D3, Calcium Carbonate-Vitamin D, betamethasone dipropionate, Jardiance, insulin degludec, ezetimibe, Dulaglutide (TRULICITY [place]), ALPRAZolam, fluticasone, and escitalopram.  Meds ordered this encounter  Medications   losartan (COZAAR) 50 MG tablet    Sig: Take 1.5 tablets (75 mg total) by mouth daily.    Dispense:  135 tablet    Refill:  1    Order Specific Question:    Supervising Provider    Answer:   Danise Edge A [[id number]]   escitalopram (LEXAPRO) 20 MG tablet    Sig: Take 1 tablet (20 mg total) by mouth daily.    Dispense:  90 tablet    Refill:  1    Order Specific Question:   Supervising Provider    Answer:   Danise Edge A [[id number]]

## 2022-11-12 NOTE — Progress Notes (Signed)
Subjective:     Patient ID: Wendy Guzman, female    DOB: Crystale Giannattasio 15, 1947, 77 y.o.   MRN: [id number]  Chief Complaint  Patient presents with   Diabetes    Here for follow up   Hypertension    Here for follow up    HPI  Discussed the use of AI scribe software for clinical note transcription with the patient, who gave verbal consent to proceed.   Patient is a 77 year old female that is here for a 6 months follow up for her blood pressure and diabetes.  Her last A1C from Dr Allena Katz was done 9/9 and it was 6.6. She states that she is having no issues taking jardiance and tresiba. Seeing weight gain recently about 4-5 pounds.  She states that she does check blood sugar at home in the morning and she states that it runs about 80s in the mornings.   Patient states that she feels like she is not doing that well with diet and exercise as she should be doing. Educated her with her diet and exercise that it is important regarding her cholesterol.   Patientstates that she is having issues falling asleep. She states that she takes benadryl in the evening to help her sleep. But when she is not taking the Benadryl she is unable to fall asleep. Patient states that Melatonin is not helping her with her sleep.      Health Maintenance Due  Topic Date Due   DTaP/Tdap/Td (1 - Tdap) Never done   Medicare Annual Wellness (AWV)  08/07/2016   HEMOGLOBIN A1C  04/12/2022   COVID-19 Vaccine (4 - 2023-24 season) 10/21/2022    Past Medical History:  Diagnosis Date   Colon polyps    Depression    Diabetes mellitus without complication (HCC)    Frequent headaches    History of chicken pox    Hyperlipidemia    Hypertension     Past Surgical History:  Procedure Laterality Date   TONSILLECTOMY  79s   TUBAL LIGATION  1976    Family History  Problem Relation Age of Onset   COPD Mother    Aneurysm Mother        AAA   Cancer Father        lung   Diabetes Father    Diabetes Paternal  Uncle    Hypertension Maternal Grandmother    Atrial fibrillation Brother    Diabetes Mellitus II Brother    Atrial fibrillation Brother    Diabetes Mellitus II Brother     Social History   Socioeconomic History   Marital status: Married    Spouse name: Not on file   Number of children: Not on file   Years of education: Not on file   Highest education level: Not on file  Occupational History   Not on file  Tobacco Use   Smoking status: Never   Smokeless tobacco: Never  Substance and Sexual Activity   Alcohol use: Yes    Alcohol/week: 2.0 - 4.0 standard drinks of alcohol    Types: 2 - 4 Glasses of wine per week   Drug use: No   Sexual activity: Not on file  Other Topics Concern   Not on file  Social History Narrative   Retired Runner, broadcasting/film/video and ten worked at the [employer]   Married   3 children, all are Designer, multimedia ([place])   Enjoys reading, Clinical cytogeneticist, quilting/crocheting   Social Determinants of Corporate investment banker Strain:  Not on file  Food Insecurity: Not on file  Transportation Needs: Not on file  Physical Activity: Not on file  Stress: Not on file  Social Connections: Not on file  Intimate Partner Violence: Not on file    Outpatient Medications Prior to Visit  Medication Sig Dispense Refill   ALPRAZolam (XANAX) 0.5 MG tablet Take 1 tablet (0.5 mg total) by mouth 3 (three) times daily as needed. 30 tablet 0   aspirin EC 81 MG tablet Take 1 tablet (81 mg total) by mouth daily.     betamethasone dipropionate (DIPROLENE) 0.05 % cream Apply topically 2 (two) times daily. 30 g 0   Calcium Carbonate-Vitamin D 600-400 MG-UNIT tablet Take 1 tablet by mouth 2 (two) times daily.     Cholecalciferol (VITAMIN D3) 10000 units TABS Take 3 tablets by mouth daily.     Dulaglutide (TRULICITY [place]) Inject into the skin. Once a week     escitalopram (LEXAPRO) 20 MG tablet Take 1 tablet (20 mg total) by mouth daily. 90 tablet 0   ezetimibe (ZETIA) 10 MG tablet Take 1 tablet  (10 mg total) by mouth daily. 90 tablet 1   fluticasone (FLONASE) 50 MCG/ACT nasal spray SPRAY 2 SPRAYS INTO EACH NOSTRIL EVERY DAY 48 mL 1   insulin degludec (TRESIBA) 100 UNIT/ML FlexTouch Pen Use as directed. Max daily dose is 50 units.     JARDIANCE 25 MG TABS tablet Take 25 mg by mouth daily.     losartan (COZAAR) 50 MG tablet Take 1 tablet (50 mg total) by mouth daily. 90 tablet 1   No facility-administered medications prior to visit.    Allergies  Allergen Reactions   Ezetimibe-Simvastatin Other (See Comments)    Extreme muscle pain and weakness   Lovastatin Other (See Comments)    Extreme muscle pain and weakness   Rosuvastatin Other (See Comments)    Extreme muscle pain and weakness    Review of Systems  Constitutional:  Negative for diaphoresis, fever and weight loss.  HENT:  Negative for congestion, ear pain, sinus pain, sore throat and tinnitus.   Eyes:  Negative for pain.  Respiratory:  Negative for cough, shortness of breath and wheezing.   Cardiovascular:  Negative for chest pain.  Gastrointestinal:  Negative for abdominal pain, heartburn, nausea and vomiting.  Genitourinary:  Negative for dysuria.  Musculoskeletal:  Negative for back pain and neck pain.  Skin:  Negative for rash.  Neurological:  Negative for dizziness and headaches.  Psychiatric/Behavioral:  Negative for depression and suicidal ideas.        Objective:    Physical Exam Constitutional:      Appearance: Normal appearance.  HENT:     Head: Normocephalic.     Nose: Nose normal.  Cardiovascular:     Rate and Rhythm: Normal rate and regular rhythm.  Musculoskeletal:        General: Normal range of motion.     Cervical back: Normal range of motion.  Skin:    General: Skin is warm.     Capillary Refill: Capillary refill takes less than 2 seconds.  Neurological:     General: No focal deficit present.     Mental Status: She is alert and oriented to person, place, and time. Mental status is at  baseline.  Psychiatric:        Mood and Affect: Mood normal.        Behavior: Behavior normal.        Thought Content: Thought content  normal.        Judgment: Judgment normal.      BP (!) 142/73 (BP Location: Right Arm, Patient Position: Sitting, Cuff Size: Large)   Pulse 87   Temp 98.1 F (36.7 C) (Oral)   Resp 16   Wt 199 lb (90.3 kg)   SpO2 94%   BMI 32.12 kg/m  Wt Readings from Last 3 Encounters:  11/12/22 199 lb (90.3 kg)  05/11/22 196 lb 3.2 oz (89 kg)  11/17/21 187 lb (84.8 kg)       Assessment & Plan:   Problem List Items Addressed This Visit   None   I am having Dartha Lodge maintain her aspirin EC, Vitamin D3, Calcium Carbonate-Vitamin D, betamethasone dipropionate, Jardiance, insulin degludec, ezetimibe, Dulaglutide (TRULICITY [place]), losartan, ALPRAZolam, fluticasone, and escitalopram.  No orders of the defined types were placed in this encounter.

## 2022-11-12 NOTE — Assessment & Plan Note (Signed)
Lab Results  Component Value Date   CHOL 192 05/11/2022   HDL 70.40 05/11/2022   LDLCALC 108 (H) 05/11/2022   TRIG 70.0 05/11/2022   CHOLHDL 3 05/11/2022   Last Lipid profile looks stable on zetia, she is statin intolerant. Monitor.

## 2022-11-12 NOTE — Assessment & Plan Note (Addendum)
Above goal, will increase losartan to 75 mg once daily.

## 2022-11-26 ENCOUNTER — Other Ambulatory Visit: Payer: Self-pay | Admitting: Family

## 2022-11-26 ENCOUNTER — Ambulatory Visit (HOSPITAL_BASED_OUTPATIENT_CLINIC_OR_DEPARTMENT_OTHER)
Admission: RE | Admit: 2022-11-26 | Discharge: 2022-11-26 | Disposition: A | Discharge status: Home / Self Care | Payer: Medicare PPO | Source: Ambulatory Visit | Attending: Family | Admitting: Family

## 2022-11-26 DIAGNOSIS — Z780 Asymptomatic menopausal state: Secondary | ICD-10-CM | POA: Insufficient documentation

## 2022-11-26 DIAGNOSIS — M810 Age-related osteoporosis without current pathological fracture: Secondary | ICD-10-CM

## 2022-11-26 DIAGNOSIS — M8589 Other specified disorders of bone density and structure, multiple sites: Secondary | ICD-10-CM | POA: Diagnosis not present

## 2022-11-27 ENCOUNTER — Other Ambulatory Visit: Payer: Medicare PPO

## 2022-11-27 ENCOUNTER — Ambulatory Visit: Payer: Medicare PPO

## 2022-11-27 DIAGNOSIS — M810 Age-related osteoporosis without current pathological fracture: Secondary | ICD-10-CM

## 2022-11-27 HISTORY — DX: Age-related osteoporosis without current pathological fracture: M81.0

## 2022-11-27 NOTE — Progress Notes (Deleted)
Pt here for Blood pressure check per Sandford Craze:  11/12/22 OV: "our blood pressure is slightly high, so I've increased your Losartan dosage to 75mg  daily. We'll check your blood pressure and labs in 2 weeks to see how you're responding to the increased dosage."  Pt currently takes: Losartan 50 mg daily  Pt reports compliance with medication.  BP today @ = HR =  Pt advised per Melissa:

## 2022-11-27 NOTE — Telephone Encounter (Signed)
Please advise pt that her bone density shows osteoporosis.  I would recommend that she add caltrate 600mg  with D twice daily as well as once weekly fosamax. Take in AM, sit upright for 90 minutes after taking.   It is also important to get regular weight bearing exercise such as walking.

## 2022-11-27 NOTE — Telephone Encounter (Signed)
Called but no answer, lvm for patient to call back about this message 

## 2022-11-28 MED ORDER — CALTRATE 600+D PLUS MINERALS 600-800 MG-UNIT PO TABS: 1.0000 | ORAL_TABLET | Freq: Two times a day (BID) | ORAL | Status: AC

## 2022-11-28 MED ORDER — ALENDRONATE SODIUM 70 MG PO TABS: 70.0000 mg | ORAL_TABLET | ORAL | 11 refills | Status: DC

## 2022-11-28 NOTE — Telephone Encounter (Signed)
Patient notified of results and recommendations. Rx for fosamax sent to her local pharmacy

## 2022-12-13 ENCOUNTER — Ambulatory Visit: Payer: Medicare PPO

## 2022-12-13 VITALS — BP 128/68 | HR 86

## 2022-12-13 DIAGNOSIS — I1A0 Resistant hypertension: Secondary | ICD-10-CM

## 2022-12-13 NOTE — Progress Notes (Signed)
Pt here for Blood pressure check ZOX:WRUEAVW Wendy Juba, NP   blood pressure is slightly high, so I've increased your Losartan dosage to 75mg  daily.  We'll check your blood pressure and labs in 2 weeks to see how you're responding to the increased dosage.   Pt currently takes:Losartan dosage to 75mg  daily    BP Readings from Last 3 Encounters:  11/12/22 (!) 142/73  05/11/22 128/74  11/17/21 126/79    Pt reports compliance with medication.  BP today @ =124/64 Left Arm 128/68 Right Arm HR =86  Pt advised per: Dr.Lowne stated follow up with  Melissa in December BP f/u and appt scheduled.

## 2023-01-15 ENCOUNTER — Ambulatory Visit: Payer: Medicare PPO | Admitting: Physician Assistant

## 2023-01-15 ENCOUNTER — Encounter: Payer: Self-pay | Admitting: Physician Assistant

## 2023-01-15 VITALS — BP 139/92 | HR 88 | Temp 97.8°F | Ht 66.0 in | Wt 200.2 lb

## 2023-01-15 DIAGNOSIS — B9689 Other specified bacterial agents as the cause of diseases classified elsewhere: Secondary | ICD-10-CM | POA: Diagnosis not present

## 2023-01-15 DIAGNOSIS — J208 Acute bronchitis due to other specified organisms: Secondary | ICD-10-CM | POA: Diagnosis not present

## 2023-01-15 MED ORDER — BENZONATATE 100 MG PO CAPS: 100.0000 mg | ORAL_CAPSULE | Freq: Two times a day (BID) | ORAL | 0 refills | Status: DC | PRN

## 2023-01-15 MED ORDER — AZITHROMYCIN 250 MG PO TABS: ORAL_TABLET | ORAL | 0 refills | Status: AC

## 2023-01-15 NOTE — Progress Notes (Signed)
Established patient visit   Patient: Wendy Guzman   DOB: 1946/02/11   77 y.o. Female  MRN: [id number] Visit Date: 01/15/2023  Today's healthcare provider: Alfredia Ferguson, PA-C   Cc. Cough, fatigue x 6-7 days  Subjective     Discussed the use of AI scribe software for clinical note transcription with the patient, who gave verbal consent to proceed.  History of Present Illness   Wendy Guzman, a patient with diabetes, presents with a persistent cough and fatigue that has been ongoing x6-7 days. She is most troubled by the fatigue and cough. She has been self-medicating with NyQuil at night and DayQuil during the day. Some ear congestion/stuffiness. Some sore throat. Denies headaches. Reports her husband has been sick as well.  Denies sob, wheezing, chest pain. No covid tests at home.  Medications: Outpatient Medications Prior to Visit  Medication Sig   alendronate (FOSAMAX) 70 MG tablet Take 1 tablet (70 mg total) by mouth every 7 (seven) days. Take with a full glass of water on an empty stomach.   ALPRAZolam (XANAX) 0.5 MG tablet Take 1 tablet (0.5 mg total) by mouth 3 (three) times daily as needed.   aspirin EC 81 MG tablet Take 1 tablet (81 mg total) by mouth daily.   betamethasone dipropionate (DIPROLENE) 0.05 % cream Apply topically 2 (two) times daily.   Calcium Carbonate-Vit D-Min (CALTRATE 600+D PLUS MINERALS) 600-800 MG-UNIT TABS Take 1 tablet by mouth in the morning and at bedtime.   Calcium Carbonate-Vitamin D 600-400 MG-UNIT tablet Take 1 tablet by mouth 2 (two) times daily.   Cholecalciferol (VITAMIN D3) 10000 units TABS Take 3 tablets by mouth daily.   Dulaglutide (TRULICITY [place]) Inject into the skin. Once a week   escitalopram (LEXAPRO) 20 MG tablet Take 1 tablet (20 mg total) by mouth daily.   ezetimibe (ZETIA) 10 MG tablet Take 1 tablet (10 mg total) by mouth daily.   fluticasone (FLONASE) 50 MCG/ACT nasal spray SPRAY 2 SPRAYS INTO EACH NOSTRIL EVERY DAY    insulin degludec (TRESIBA) 100 UNIT/ML FlexTouch Pen Use as directed. Max daily dose is 50 units.   JARDIANCE 25 MG TABS tablet Take 25 mg by mouth daily.   losartan (COZAAR) 50 MG tablet Take 1.5 tablets (75 mg total) by mouth daily.   No facility-administered medications prior to visit.    Review of Systems  Constitutional:  Positive for fatigue. Negative for fever.  HENT:  Positive for congestion and sore throat.   Respiratory:  Positive for cough. Negative for shortness of breath.   Cardiovascular:  Negative for chest pain and leg swelling.  Gastrointestinal:  Negative for abdominal pain.  Neurological:  Negative for dizziness and headaches.       Objective    BP (!) 139/92   Pulse 88   Temp 97.8 F (36.6 C) (Oral)   Ht 5\' 6"  (1.676 m)   Wt 200 lb 4 oz (90.8 kg)   SpO2 98%   BMI 32.32 kg/m    Physical Exam Constitutional:      General: She is awake.     Appearance: She is well-developed.  HENT:     Head: Normocephalic.     Right Ear: Tympanic membrane normal.     Left Ear: Tympanic membrane normal.     Mouth/Throat:     Pharynx: Posterior oropharyngeal erythema present. No oropharyngeal exudate.  Eyes:     Conjunctiva/sclera: Conjunctivae normal.  Cardiovascular:     Rate and Rhythm:  Normal rate and regular rhythm.     Heart sounds: Normal heart sounds.  Pulmonary:     Effort: Pulmonary effort is normal.     Breath sounds: Normal breath sounds. No wheezing, rhonchi or rales.  Skin:    General: Skin is warm.  Neurological:     Mental Status: She is alert and oriented to person, place, and time.  Psychiatric:        Attention and Perception: Attention normal.        Mood and Affect: Mood normal.        Speech: Speech normal.        Behavior: Behavior is cooperative.      No results found for any visits on 01/15/23.  Assessment & Plan    Acute bacterial bronchitis -     Benzonatate; Take 1 capsule (100 mg total) by mouth 2 (two) times daily as  needed for cough.  Dispense: 20 capsule; Refill: 0 -     Azithromycin; Take 2 tablets on day 1, then 1 tablet daily on days 2 through 5  Dispense: 6 tablet; Refill: 0   Recommending rest, hydration, otc antihistamines, corcidin cough/cold otc.  Will rx zpack, tessalon pearles.   Return if symptoms worsen or fail to improve.       Alfredia Ferguson, PA-C  [facility] [phone] (phone) [phone] (fax)  [facility]

## 2023-01-21 ENCOUNTER — Ambulatory Visit: Payer: Medicare PPO | Admitting: Family

## 2023-02-06 ENCOUNTER — Ambulatory Visit: Payer: Medicare PPO | Admitting: Family

## 2023-02-06 ENCOUNTER — Telehealth: Payer: Self-pay | Admitting: Family

## 2023-02-06 VITALS — BP 123/67 | HR 82 | Temp 98.0°F | Ht 66.0 in | Wt 201.0 lb

## 2023-02-06 DIAGNOSIS — I10 Essential (primary) hypertension: Secondary | ICD-10-CM | POA: Diagnosis not present

## 2023-02-06 DIAGNOSIS — Z7984 Long term (current) use of oral hypoglycemic drugs: Secondary | ICD-10-CM

## 2023-02-06 DIAGNOSIS — E113291 Type 2 diabetes mellitus with mild nonproliferative diabetic retinopathy without macular edema, right eye: Secondary | ICD-10-CM | POA: Diagnosis not present

## 2023-02-06 DIAGNOSIS — F418 Other specified anxiety disorders: Secondary | ICD-10-CM | POA: Diagnosis not present

## 2023-02-06 DIAGNOSIS — M810 Age-related osteoporosis without current pathological fracture: Secondary | ICD-10-CM

## 2023-02-06 DIAGNOSIS — Z7985 Long-term (current) use of injectable non-insulin antidiabetic drugs: Secondary | ICD-10-CM

## 2023-02-06 DIAGNOSIS — E785 Hyperlipidemia, unspecified: Secondary | ICD-10-CM | POA: Diagnosis not present

## 2023-02-06 LAB — BASIC METABOLIC PANEL
BUN: 18 mg/dL (ref 6–23)
CO2: 28 meq/L (ref 19–32)
Calcium: 9.9 mg/dL (ref 8.4–10.5)
Chloride: 107 meq/L (ref 96–112)
Creatinine, Ser: 0.92 mg/dL (ref 0.40–1.20)
GFR: 60.63 mL/min (ref >60.00)
Glucose, Bld: 72 mg/dL (ref 70–99)
Potassium: 4.6 meq/L (ref 3.5–5.1)
Sodium: 141 meq/L (ref 135–145)

## 2023-02-06 LAB — MICROALBUMIN / CREATININE URINE RATIO
Creatinine,U: 141.6 mg/dL
Microalb Creat Ratio: 0.7 mg/g (ref 0.0–30.0)
Microalb, Ur: 1 mg/dL (ref 0.0–1.9)

## 2023-02-06 MED ORDER — LOSARTAN POTASSIUM 100 MG PO TABS: 100.0000 mg | ORAL_TABLET | Freq: Every day | ORAL | 1 refills | Status: DC

## 2023-02-06 MED ORDER — TETANUS-DIPHTHERIA TOXOIDS TD 2-2 LF/0.5ML IM SUSP: 0.5000 mL | Freq: Once | INTRAMUSCULAR | 0 refills | Status: AC

## 2023-02-06 NOTE — Patient Instructions (Signed)
VISIT SUMMARY:  During your follow-up visit, we reviewed your current health status and medications. Your diabetes is well controlled with an A1c of 6.6. We discussed your confusion about the losartan dosage for hypertension and made adjustments. We also talked about your osteoporosis treatment and the importance of calcium supplements and exercise. Your anxiety and depression are well managed with Lexapro, and you have not needed Xanax recently. We also reviewed your general health maintenance and planned for necessary vaccinations and lab work.  YOUR PLAN:  -TYPE 2 DIABETES MELLITUS: Type 2 Diabetes Mellitus is a condition where your body does not use insulin properly, leading to high blood sugar levels. Your diabetes is well controlled with your current medications: Tresiba (30 units daily), Jardiance (25mg  daily), and Trulicity (once weekly). Continue with this regimen.  -HYPERTENSION: Hypertension, or high blood pressure, can lead to serious health issues if not managed properly. You were feeling unwell with a higher dose of losartan, so we have adjusted it to 100mg  daily for convenience. Continue taking 100mg  daily.  -OSTEOPOROSIS: Osteoporosis is a condition that weakens bones, making them fragile and more likely to break. You are taking Fosamax weekly and we recommend starting Calcium 600mg  with Vitamin D twice daily. Regular weight-bearing exercise is also encouraged.  -HYPERLIPIDEMIA: Hyperlipidemia is having high levels of fats (lipids) in your blood, which can increase the risk of heart disease. Your LDL is slightly elevated, but you are unable to tolerate statins. Continue taking Zetia as prescribed.  -VERTIGO: Vertigo is a sensation of feeling off balance, often caused by inner ear problems. You have not had recent symptoms, so continue with your current management.  -ANXIETY/DEPRESSION: Anxiety and depression are mental health conditions that can affect your mood and overall  well-being. Your symptoms are well controlled with Lexapro. You have Xanax available as needed, but you have not used it recently. Continue with Lexapro and use Xanax as needed.  INSTRUCTIONS:  - Order lab work to check kidney function. - Schedule a Medicare wellness visit over the phone with the nurse. - Get a flu shot and COVID booster at an outside pharmacy; they can be administered at the same time. A prescription for a tetanus booster will be sent to your pharmacy. - Follow up in 6 months.

## 2023-02-06 NOTE — Telephone Encounter (Signed)
Could you please abstract the A1C from December at [facility]? 6.6. tks

## 2023-02-06 NOTE — Progress Notes (Signed)
Subjective:     Patient ID: Wendy Guzman, female    DOB: [birth year], 77 y.o.   MRN: [id number]  Chief Complaint  Patient presents with   Hypertension    Here for follow up, patient reports Wendy Guzman is taking Losartan 50 mg 2 tablets a day.     Discussed the use of AI scribe software for clinical note transcription with the patient, who gave verbal consent to proceed.  History of Present Illness   Wendy Guzman, a patient with a history of diabetes, hypertension, osteoporosis, vertigo, and anxiety/depression, presents for a follow-up visit. Wendy Guzman reports confusion about Wendy Guzman losartan dosage for hypertension. Wendy Guzman had been taking two 50mg  tablets daily, but after feeling unwell, Wendy Guzman reduced the dosage to one and a half tablets. Wendy Guzman also mentions that Wendy Guzman has been taking [place] (30 units daily), Jardiance (25mg  daily), and Trulicity (once weekly) for Wendy Guzman diabetes. Wendy Guzman last A1c was 6.6, indicating good control of Wendy Guzman diabetes.  Wendy Guzman had a bout of bronchitis a few weeks ago, which has since resolved. Wendy Guzman also mentions taking Fosamax once a week for osteoporosis and attends a Bible study on the same day. Wendy Guzman does not currently take a calcium supplement but is open to starting one. Wendy Guzman also acknowledges the need for regular exercise for bone health.  Wendy Guzman also has a history of vertigo, but Wendy Guzman has not had any recent symptoms. Wendy Guzman is currently taking Lexapro for anxiety/depression and reports that it is working well. Wendy Guzman has Xanax available as needed but has not used it in a while.          Health Maintenance Due  Topic Date Due   DTaP/Tdap/Td (1 - Tdap) Never done   Medicare Annual Wellness (AWV)  08/07/2016   COVID-19 Vaccine (4 - 2024-25 season) 10/21/2022    Past Medical History:  Diagnosis Date   Colon polyps    Depression    Diabetes mellitus without complication (HCC)    Frequent headaches    History of chicken pox    Hyperlipidemia    Hypertension     Past Surgical  History:  Procedure Laterality Date   TONSILLECTOMY  62s   TUBAL LIGATION  1976    Family History  Problem Relation Age of Onset   COPD Mother    Aneurysm Mother        AAA   Cancer Father        lung   Diabetes Father    Diabetes Paternal Uncle    Hypertension Maternal Grandmother    Atrial fibrillation Brother    Diabetes Mellitus II Brother    Atrial fibrillation Brother    Diabetes Mellitus II Brother     Social History   Socioeconomic History   Marital status: Married    Spouse name: Not on file   Number of children: Not on file   Years of education: Not on file   Highest education level: Not on file  Occupational History   Not on file  Tobacco Use   Smoking status: Never   Smokeless tobacco: Never  Substance and Sexual Activity   Alcohol use: Yes    Alcohol/week: 2.0 - 4.0 standard drinks of alcohol    Types: 2 - 4 Glasses of wine per week   Drug use: No   Sexual activity: Not on file  Other Topics Concern   Not on file  Social History Narrative   Retired Runner, broadcasting/film/video and ten worked at the [employer]  3 children, all are local ([place])   Enjoys reading, crafting, quilting/crocheting   Social Drivers of Corporate investment banker Strain: Not on file  Food Insecurity: Not on file  Transportation Needs: Not on file  Physical Activity: Not on file  Stress: Not on file  Social Connections: Not on file  Intimate Partner Violence: Not on file    Outpatient Medications Prior to Visit  Medication Sig Dispense Refill   alendronate (FOSAMAX) 70 MG tablet Take 1 tablet (70 mg total) by mouth every 7 (seven) days. Take with a full glass of water on an empty stomach. 4 tablet 11   ALPRAZolam (XANAX) 0.5 MG tablet Take 1 tablet (0.5 mg total) by mouth 3 (three) times daily as needed. 30 tablet 0   aspirin EC 81 MG tablet Take 1 tablet (81 mg total) by mouth daily.     benzonatate (TESSALON) 100 MG capsule Take 1 capsule (100 mg total) by  mouth 2 (two) times daily as needed for cough. 20 capsule 0   betamethasone dipropionate (DIPROLENE) 0.05 % cream Apply topically 2 (two) times daily. 30 g 0   Calcium Carbonate-Vit D-Min (CALTRATE 600+D PLUS MINERALS) 600-800 MG-UNIT TABS Take 1 tablet by mouth in the morning and at bedtime.     Calcium Carbonate-Vitamin D 600-400 MG-UNIT tablet Take 1 tablet by mouth 2 (two) times daily.     Cholecalciferol (VITAMIN D3) 10000 units TABS Take 3 tablets by mouth daily.     Dulaglutide (TRULICITY [place]) Inject into the skin. Once a week     escitalopram (LEXAPRO) 20 MG tablet Take 1 tablet (20 mg total) by mouth daily. 90 tablet 1   ezetimibe (ZETIA) 10 MG tablet Take 1 tablet (10 mg total) by mouth daily. 90 tablet 1   fluticasone (FLONASE) 50 MCG/ACT nasal spray SPRAY 2 SPRAYS INTO EACH NOSTRIL EVERY DAY 48 mL 1   insulin degludec (TRESIBA) 100 UNIT/ML FlexTouch Pen Use as directed. Max daily dose is 50 units.     JARDIANCE 25 MG TABS tablet Take 25 mg by mouth daily.     losartan (COZAAR) 50 MG tablet Take 1.5 tablets (75 mg total) by mouth daily. 135 tablet 1   No facility-administered medications prior to visit.    Allergies  Allergen Reactions   Ezetimibe-Simvastatin Other (See Comments)    Extreme muscle pain and weakness   Lovastatin Other (See Comments)    Extreme muscle pain and weakness   Rosuvastatin Other (See Comments)    Extreme muscle pain and weakness    ROS See HPI    Objective:    Physical Exam Constitutional:      General: Wendy Guzman is not in acute distress.    Appearance: Normal appearance. Wendy Guzman is well-developed.  HENT:     Head: Normocephalic and atraumatic.     Right Ear: External ear normal.     Left Ear: External ear normal.  Eyes:     General: No scleral icterus. Neck:     Thyroid: No thyromegaly.  Cardiovascular:     Rate and Rhythm: Normal rate and regular rhythm.     Heart sounds: Normal heart sounds. No murmur heard. Pulmonary:     Effort: Pulmonary  effort is normal. No respiratory distress.     Breath sounds: Normal breath sounds. No wheezing.  Musculoskeletal:     Cervical back: Neck supple.  Skin:    General: Skin is warm and dry.  Neurological:     Mental Status:  Wendy Guzman is alert and oriented to person, place, and time.  Psychiatric:        Mood and Affect: Mood normal.        Behavior: Behavior normal.        Thought Content: Thought content normal.        Judgment: Judgment normal.      BP 123/67 (BP Location: Right Arm, Patient Position: Sitting, Cuff Size: Large)   Pulse 82   Temp 98 F (36.7 C) (Oral)   Ht 5\' 6"  (1.676 m)   Wt 201 lb (91.2 kg)   BMI 32.44 kg/m  Wt Readings from Last 3 Encounters:  02/06/23 201 lb (91.2 kg)  01/15/23 200 lb 4 oz (90.8 kg)  11/12/22 199 lb (90.3 kg)       Diabetic Foot Exam - Simple   Simple Foot Form Diabetic Foot exam was performed with the following findings: Yes 02/06/2023  8:41 AM  Visual Inspection No deformities, no ulcerations, no other skin breakdown bilaterally: Yes Sensation Testing Intact to touch and monofilament testing bilaterally: Yes Pulse Check Posterior Tibialis and Dorsalis pulse intact bilaterally: Yes Comments     Assessment & Plan:   Problem List Items Addressed This Visit       Unprioritized   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 Currently on Tresiba 30 units daily, Jardiance 25mg  daily, and Trulicity weekly. -Continue current regimen and follow up with endocrinology.       Relevant Medications   losartan (COZAAR) 100 MG tablet   Other Relevant Orders   Urine Microalbumin w/creat. ratio   Osteoporosis without current pathological fracture   Not taking calcium, but is taking fosamax. Restart calcium supplement encouraged walking daily.        Hyperlipidemia   Lab Results   Component Value Date   CHOL 192 05/11/2022   HDL 70.40 05/11/2022   LDLCALC 108 (H) 05/11/2022   TRIG 70.0 05/11/2022   CHOLHDL 3 05/11/2022   Almost at goal on zetia, statin intolerant. Continue low fat diet and zetia.       Relevant Medications   losartan (COZAAR) 100 MG tablet   HTN (hypertension)   BP Readings from Last 3 Encounters:  02/06/23 123/67  01/15/23 (!) 139/92  12/13/22 128/68   BP stable, continue losartan 100mg  once daily.       Relevant Medications   losartan (COZAAR) 100 MG tablet   Other Relevant Orders   Basic Metabolic Panel (BMET)   Depression with anxiety   Mood is stable on lexapro 20mg , continue same. Rare use of xanax. Contract up to date.       General Health Maintenance / Followup Plans -Order lab work to check kidney function. -Schedule Medicare wellness visit over the phone with the nurse. -Recommend flu shot, COVID booster, and tetanus booster. Flu shot and COVID booster can be administered at the same time at an outside pharmacy. Tetanus booster will be sent as a prescription to the pharmacy. -Follow up in 6 months.  I have discontinued Santina Evans P. Hurlbutt's losartan. I am also having Wendy Guzman start on losartan and diptheria-tetanus toxoids. Additionally, I am having Wendy Guzman maintain Wendy Guzman aspirin EC, Vitamin D3, Calcium Carbonate-Vitamin D, betamethasone dipropionate, Jardiance, insulin degludec, ezetimibe, Dulaglutide (TRULICITY [place]), ALPRAZolam, fluticasone, escitalopram, alendronate, Caltrate 600+D Plus Minerals, and benzonatate.  Meds ordered this encounter  Medications   losartan (COZAAR) 100 MG tablet    Sig: Take 1 tablet (100 mg total) by mouth daily.    Dispense:  90 tablet    Refill:  1    Supervising Provider:   Danise Edge A [[id number]]   diptheria-tetanus toxoids (DECAVAC) 2-2 LF/0.5ML injection    Sig: Inject 0.5 mLs into the muscle once for 1 dose.    Dispense:  0.5 mL    Refill:  0    Supervising Provider:   Danise Edge A [[id number]]

## 2023-02-06 NOTE — Assessment & Plan Note (Addendum)
Mood is stable on lexapro 20mg , continue same. Rare use of xanax. Contract up to date.

## 2023-02-06 NOTE — Assessment & Plan Note (Signed)
BP Readings from Last 3 Encounters:  02/06/23 123/67  01/15/23 (!) 139/92  12/13/22 128/68   BP stable, continue losartan 100mg  once daily.

## 2023-02-06 NOTE — Assessment & Plan Note (Signed)
Not taking calcium, but is taking fosamax. Restart calcium supplement encouraged walking daily.

## 2023-02-06 NOTE — Assessment & Plan Note (Signed)
Lab Results  Component Value Date   CHOL 192 05/11/2022   HDL 70.40 05/11/2022   LDLCALC 108 (H) 05/11/2022   TRIG 70.0 05/11/2022   CHOLHDL 3 05/11/2022   Almost at goal on zetia, statin intolerant. Continue low fat diet and zetia.

## 2023-02-06 NOTE — Assessment & Plan Note (Signed)
  Well controlled with last A1c at 6.6. Currently on Tresiba 30 units daily, Jardiance 25mg  daily, and Trulicity weekly. -Continue current regimen and follow up with endocrinology.

## 2023-02-06 NOTE — Telephone Encounter (Signed)
Nevermind- It automatically flowed. tks

## 2023-04-19 ENCOUNTER — Other Ambulatory Visit: Payer: Self-pay | Admitting: Family

## 2023-04-29 DIAGNOSIS — E785 Hyperlipidemia, unspecified: Secondary | ICD-10-CM | POA: Diagnosis not present

## 2023-04-29 DIAGNOSIS — E113291 Type 2 diabetes mellitus with mild nonproliferative diabetic retinopathy without macular edema, right eye: Secondary | ICD-10-CM | POA: Diagnosis not present

## 2023-04-29 DIAGNOSIS — I10 Essential (primary) hypertension: Secondary | ICD-10-CM | POA: Diagnosis not present

## 2023-05-13 ENCOUNTER — Ambulatory Visit: Payer: Medicare PPO | Admitting: Family

## 2023-05-15 ENCOUNTER — Ambulatory Visit: Payer: Medicare PPO | Admitting: Family

## 2023-05-15 VITALS — BP 130/79 | HR 81 | Temp 98.4°F | Resp 16 | Ht 66.0 in | Wt 198.0 lb

## 2023-05-15 DIAGNOSIS — I10 Essential (primary) hypertension: Secondary | ICD-10-CM | POA: Diagnosis not present

## 2023-05-15 DIAGNOSIS — E785 Hyperlipidemia, unspecified: Secondary | ICD-10-CM

## 2023-05-15 DIAGNOSIS — J309 Allergic rhinitis, unspecified: Secondary | ICD-10-CM | POA: Diagnosis not present

## 2023-05-15 DIAGNOSIS — M810 Age-related osteoporosis without current pathological fracture: Secondary | ICD-10-CM

## 2023-05-15 DIAGNOSIS — E113291 Type 2 diabetes mellitus with mild nonproliferative diabetic retinopathy without macular edema, right eye: Secondary | ICD-10-CM

## 2023-05-15 DIAGNOSIS — Z7984 Long term (current) use of oral hypoglycemic drugs: Secondary | ICD-10-CM | POA: Diagnosis not present

## 2023-05-15 DIAGNOSIS — Z7985 Long-term (current) use of injectable non-insulin antidiabetic drugs: Secondary | ICD-10-CM

## 2023-05-15 DIAGNOSIS — F418 Other specified anxiety disorders: Secondary | ICD-10-CM | POA: Diagnosis not present

## 2023-05-15 HISTORY — DX: Allergic rhinitis, unspecified: J30.9

## 2023-05-15 LAB — COMPREHENSIVE METABOLIC PANEL
ALT: 25 U/L (ref 0–35)
AST: 24 U/L (ref 0–37)
Albumin: 4.5 g/dL (ref 3.5–5.2)
Alkaline Phosphatase: 78 U/L (ref 39–117)
BUN: 15 mg/dL (ref 6–23)
CO2: 27 meq/L (ref 19–32)
Calcium: 10.4 mg/dL (ref 8.4–10.5)
Chloride: 105 meq/L (ref 96–112)
Creatinine, Ser: 0.94 mg/dL (ref 0.40–1.20)
GFR: 58.98 mL/min — ABNORMAL LOW (ref >60.00)
Glucose, Bld: 93 mg/dL (ref 70–99)
Potassium: 4 meq/L (ref 3.5–5.1)
Sodium: 140 meq/L (ref 135–145)
Total Bilirubin: 0.8 mg/dL (ref 0.2–1.2)
Total Protein: 6.8 g/dL (ref 6.0–8.3)

## 2023-05-15 LAB — LIPID PANEL
Cholesterol: 171 mg/dL (ref 0–200)
HDL: 60.3 mg/dL (ref >39.00)
LDL Cholesterol: 96 mg/dL (ref 0–99)
NonHDL: 110.22
Total CHOL/HDL Ratio: 3
Triglycerides: 72 mg/dL (ref 0.0–149.0)
VLDL: 14.4 mg/dL (ref 0.0–40.0)

## 2023-05-15 LAB — HEMOGLOBIN A1C: Hgb A1c MFr Bld: 7.1 % — ABNORMAL HIGH (ref 4.6–6.5)

## 2023-05-15 MED ORDER — ALENDRONATE SODIUM 70 MG PO TABS: 70.0000 mg | ORAL_TABLET | ORAL | 4 refills | Status: AC

## 2023-05-15 MED ORDER — SEMAGLUTIDE (1 MG/DOSE) 4 MG/3ML ~~LOC~~ SOPN: 1.0000 mg | PEN_INJECTOR | SUBCUTANEOUS | Status: DC

## 2023-05-15 NOTE — Assessment & Plan Note (Signed)
 BP Readings from Last 3 Encounters:  05/15/23 130/79  02/06/23 123/67  01/15/23 (!) 139/92   At goal, continue losartan 100mg .

## 2023-05-15 NOTE — Patient Instructions (Signed)
 VISIT SUMMARY:  Today, we reviewed your progress in managing diabetes, hypertension, cholesterol, osteoporosis, anxiety, and allergies. Your diabetes management has improved significantly, and your bone density remains stable. We discussed adjustments to your medications and continued your current treatments for other conditions.  YOUR PLAN:  -TYPE 2 DIABETES MELLITUS: Type 2 Diabetes Mellitus is a condition where your body does not use insulin properly, leading to high blood sugar levels. Your blood sugar control has improved, but you occasionally experience low blood sugar. We will reduce your Tresiba dose from 32 units to 29 units. Please continue taking Jardiance and Ozempic as prescribed, and monitor your blood sugar levels closely. Report any persistent low blood sugar to your endocrinologist.  -HYPERTENSION: Hypertension is high blood pressure. Your blood pressure is well-controlled with losartan 100 mg. Please continue taking losartan 100 mg daily.  -HYPERLIPIDEMIA: Hyperlipidemia is high cholesterol. You are managing it with Zetia since statins cause muscle pain for you. We will order a lipid panel to check your cholesterol levels and continue Zetia for cholesterol management.  -OSTEOPOROSIS: Osteoporosis is a condition where bones become weak and brittle. Your bone density has remained stable. We will prescribe Fosamax and send the prescription to [facility] on [address]. Please continue taking calcium supplements and engage in regular weight-bearing exercise like walking.  -ANXIETY: Anxiety is a condition characterized by feelings of worry or fear. Your mood is stable with Lexapro and occasional Xanax. Please continue taking Lexapro as prescribed and use Xanax as needed, about once a month.  -ALLERGIC RHINITIS: Allergic Rhinitis is an allergic reaction that causes sneezing, congestion, and a runny nose. You are managing it with Flonase. Continue using Flonase as needed, and consider adding  Claritin if your symptoms worsen.  INSTRUCTIONS:  Please follow up with your endocrinologist if you experience persistent low blood sugar. We will order a lipid panel to check your cholesterol levels. Your Fosamax prescription will be sent to [facility] on [address]. Continue with your current medications and lifestyle habits, and monitor your symptoms closely.

## 2023-05-15 NOTE — Progress Notes (Signed)
 Subjective:     Patient ID: Wendy Guzman, female    DOB: [birth year], 78 y.o.   MRN: [id number]  Chief Complaint  Patient presents with   Diabetes    Here for follow up    Hypertension    Here for follow up   Anxiety    "Doing will on medication"    Diabetes  Hypertension Associated symptoms include anxiety.  Anxiety      Discussed the use of AI scribe software for clinical note transcription with the patient, who gave verbal consent to proceed.  History of Present Illness   Wendy Guzman is a 78 year old female who presents for a six month follow-up.  She has shown improvement in diabetes management, with her A1c decreasing from 10.6 to 8.6, and most recently to 6.6 as of September. She is currently taking Jardiance 25 mg and Tresiba insulin, using 32 units typically. Her blood sugars have been 'very good' recently, although she occasionally experiences hypoglycemia, with a reading of 62 this morning. She is also on Ozempic, prescribed by endocrinology, at a dose of 1 mg weekly.  Her bone density has remained stable since 2019. She is taking calcium supplements and is prescribed Fosamax, which she needs refilled. She engages in regular exercise, including walking.  For her cholesterol, she is taking Zetia. She reports that statins cause her muscle pain, so she avoids them.  Her blood pressure is managed with losartan 100 mg, and her mood is stable on Lexapro. She uses Xanax approximately once a month.  She uses Flonase as needed for allergies, which she finds helpful, but does not take an antihistamine.     Health Maintenance Due  Topic Date Due   DTaP/Tdap/Td (1 - Tdap) Never done   Medicare Annual Wellness (AWV)  08/07/2016   COVID-19 Vaccine (4 - 2024-25 season) 10/21/2022   HEMOGLOBIN A1C  04/28/2023    Past Medical History:  Diagnosis Date   Colon polyps    Depression    Diabetes mellitus without complication (HCC)    Frequent  headaches    History of chicken pox    Hyperlipidemia    Hypertension     Past Surgical History:  Procedure Laterality Date   TONSILLECTOMY  1950s   TUBAL LIGATION  1976    Family History  Problem Relation Age of Onset   COPD Mother    Aneurysm Mother        AAA   Cancer Father        lung   Diabetes Father    Diabetes Paternal Uncle    Hypertension Maternal Grandmother    Atrial fibrillation Brother    Diabetes Mellitus II Brother    Atrial fibrillation Brother    Diabetes Mellitus II Brother     Social History   Socioeconomic History   Marital status: Married    Spouse name: Not on file   Number of children: Not on file   Years of education: Not on file   Highest education level: Not on file  Occupational History   Not on file  Tobacco Use   Smoking status: Never   Smokeless tobacco: Never  Substance and Sexual Activity   Alcohol use: Yes    Alcohol/week: 2.0 - 4.0 standard drinks of alcohol    Types: 2 - 4 Glasses of wine per week   Drug use: No   Sexual activity: Not on file  Other Topics Concern   Not on file  Social History Narrative   Retired Runner, broadcasting/film/video and ten worked at the [employer]   3 children, all are Designer, multimedia ([place])   Enjoys reading, Clinical cytogeneticist, quilting/crocheting   Social Drivers of Corporate investment banker Strain: Not on [employer] Insecurity: Not on file  Transportation Needs: Not on file  Physical Activity: Not on file  Stress: Not on file  Social Connections: Not on file  Intimate Partner Violence: Not on file    Outpatient Medications Prior to Visit  Medication Sig Dispense Refill   ALPRAZolam (XANAX) 0.5 MG tablet Take 1 tablet (0.5 mg total) by mouth 3 (three) times daily as needed. 30 tablet 0   aspirin EC 81 MG tablet Take 1 tablet (81 mg total) by mouth daily.     betamethasone dipropionate (DIPROLENE) 0.05 % cream Apply topically 2 (two) times daily. 30 g 0   Calcium Carbonate-Vit D-Min (CALTRATE 600+D  PLUS MINERALS) 600-800 MG-UNIT TABS Take 1 tablet by mouth in the morning and at bedtime.     Calcium Carbonate-Vitamin D 600-400 MG-UNIT tablet Take 1 tablet by mouth 2 (two) times daily.     Cholecalciferol (VITAMIN D3) 10000 units TABS Take 3 tablets by mouth daily.     escitalopram (LEXAPRO) 20 MG tablet TAKE 1 TABLET BY MOUTH EVERY DAY 90 tablet 1   ezetimibe (ZETIA) 10 MG tablet Take 1 tablet (10 mg total) by mouth daily. 90 tablet 1   fluticasone (FLONASE) 50 MCG/ACT nasal spray SPRAY 2 SPRAYS INTO EACH NOSTRIL EVERY DAY 48 mL 1   insulin degludec (TRESIBA) 100 UNIT/ML FlexTouch Pen Use as directed. Max daily dose is 50 units.     JARDIANCE 25 MG TABS tablet Take 25 mg by mouth daily.     losartan (COZAAR) 100 MG tablet Take 1 tablet (100 mg total) by mouth daily. 90 tablet 1   benzonatate (TESSALON) 100 MG capsule Take 1 capsule (100 mg total) by mouth 2 (two) times daily as needed for cough. 20 capsule 0   Dulaglutide (TRULICITY [place]) Inject into the skin. Once a week     alendronate (FOSAMAX) 70 MG tablet Take 1 tablet (70 mg total) by mouth every 7 (seven) days. Take with a full glass of water on an empty stomach. (Patient not taking: Reported on 05/15/2023) 4 tablet 11   No facility-administered medications prior to visit.    Allergies  Allergen Reactions   Ezetimibe-Simvastatin Other (See Comments)    Extreme muscle pain and weakness   Lovastatin Other (See Comments)    Extreme muscle pain and weakness   Rosuvastatin Other (See Comments)    Extreme muscle pain and weakness    ROS See HPI    Objective:    Physical Exam Constitutional:      General: She is not in acute distress.    Appearance: Normal appearance. She is well-developed.  HENT:     Head: Normocephalic and atraumatic.     Right Ear: External ear normal.     Left Ear: External ear normal.  Eyes:     General: No scleral icterus. Neck:     Thyroid: No thyromegaly.  Cardiovascular:     Rate and Rhythm:  Normal rate and regular rhythm.     Heart sounds: Normal heart sounds. No murmur heard. Pulmonary:     Effort: Pulmonary effort is normal. No respiratory distress.     Breath sounds: Normal breath sounds. No wheezing.  Musculoskeletal:     Cervical  back: Neck supple.  Skin:    General: Skin is warm and dry.  Neurological:     Mental Status: She is alert and oriented to person, place, and time.  Psychiatric:        Mood and Affect: Mood normal.        Behavior: Behavior normal.        Thought Content: Thought content normal.        Judgment: Judgment normal.      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  Will decrease the tresiba from 32 units to 29 units. Continue follow up with Endo, along with jardiance and ozempic 1mg .         Relevant Medications   Semaglutide, 1 MG/DOSE, 4 MG/3ML SOPN   Other Relevant Orders   Comp Met (CMET)   HgB A1c   Urine Microalbumin w/creat. ratio   Osteoporosis without current pathological fracture   Dexa up to date- stable.  Continue fosamax, caltrate.  Encouraged regular walking.        Relevant Medications    alendronate (FOSAMAX) 70 MG tablet   Hyperlipidemia   Relevant Orders   Lipid panel   HTN (hypertension)   BP Readings from Last 3 Encounters:  05/15/23 130/79  02/06/23 123/67  01/15/23 (!) 139/92   At goal, continue losartan 100mg .        Depression with anxiety   Reports stable mood on lexapro. Continue same. Rare use of xanax.        Allergic rhinitis   Stable with prn flonase.         I have discontinued Ena Dawley. [place]'s Dulaglutide (TRULICITY [place]) and benzonatate. I am also having her start on Semaglutide (1 MG/DOSE). Additionally, I am having her maintain her aspirin EC, Vitamin D3, Calcium Carbonate-Vitamin D, betamethasone dipropionate, Jardiance, insulin degludec, ezetimibe, ALPRAZolam, fluticasone, Caltrate 600+D Plus Minerals, losartan, escitalopram, and alendronate.  Meds ordered this encounter  Medications   Semaglutide, 1 MG/DOSE, 4 MG/3ML SOPN    Sig: Inject 1 mg as directed once a week.    Supervising Provider:   Danise Edge A [[id number]]   alendronate (FOSAMAX) 70 MG tablet    Sig: Take 1 tablet (70 mg total) by mouth every 7 (seven) days. Take with a full glass of water on an empty stomach.    Dispense:  12 tablet    Refill:  4    Supervising Provider:   Abner Greenspan,  STACEY A [[id number]]

## 2023-05-15 NOTE — Assessment & Plan Note (Signed)
 Reports stable mood on lexapro. Continue same. Rare use of xanax.

## 2023-05-15 NOTE — Assessment & Plan Note (Signed)
Stable with prn flonase.

## 2023-05-15 NOTE — Assessment & Plan Note (Signed)
 Dexa up to date- stable.  Continue fosamax, caltrate.  Encouraged regular walking.

## 2023-05-15 NOTE — Assessment & Plan Note (Signed)
 Lab Results  Component Value Date   HGBA1C 6.6 10/29/2022   HGBA1C 8.6 10/10/2021   HGBA1C 10.6 04/03/2021   Lab Results  Component Value Date   MICROALBUR 1.0 02/06/2023   LDLCALC 108 (H) 05/11/2022   CREATININE 0.92 02/06/2023   She reports low sugar this AM, has had a few lows.  Will decrease the tresiba from 32 units to 29 units. Continue follow up with Endo, along with jardiance and ozempic 1mg .

## 2023-05-16 ENCOUNTER — Encounter: Payer: Self-pay | Admitting: Family

## 2023-07-03 ENCOUNTER — Telehealth: Payer: Self-pay | Admitting: Family

## 2023-07-03 NOTE — Telephone Encounter (Signed)
 Copied from CRM [id number]. Topic: Medicare AWV >> Jul 03, 2023  1:44 PM Juliana Ocean wrote: Reason for CRM: LVM 07/03/2023 to schedule AWV. Please schedule Virtual or Telehealth visits ONLY  Rosalee Collins; Care Guide Ambulatory Clinical Support [place] [facility] Direct Dial: [phone]

## 2023-07-12 ENCOUNTER — Telehealth: Payer: Self-pay

## 2023-07-12 ENCOUNTER — Ambulatory Visit (INDEPENDENT_AMBULATORY_CARE_PROVIDER_SITE_OTHER): Admitting: Family

## 2023-07-12 ENCOUNTER — Other Ambulatory Visit (HOSPITAL_COMMUNITY): Payer: Self-pay

## 2023-07-12 VITALS — BP 134/65 | HR 83 | Temp 98.6°F | Resp 16 | Ht 66.0 in | Wt 199.0 lb

## 2023-07-12 DIAGNOSIS — G459 Transient cerebral ischemic attack, unspecified: Secondary | ICD-10-CM | POA: Diagnosis not present

## 2023-07-12 DIAGNOSIS — F418 Other specified anxiety disorders: Secondary | ICD-10-CM

## 2023-07-12 DIAGNOSIS — R9431 Abnormal electrocardiogram [ECG] [EKG]: Secondary | ICD-10-CM | POA: Insufficient documentation

## 2023-07-12 DIAGNOSIS — Z8673 Personal history of transient ischemic attack (TIA), and cerebral infarction without residual deficits: Secondary | ICD-10-CM

## 2023-07-12 DIAGNOSIS — E785 Hyperlipidemia, unspecified: Secondary | ICD-10-CM | POA: Diagnosis not present

## 2023-07-12 DIAGNOSIS — E113291 Type 2 diabetes mellitus with mild nonproliferative diabetic retinopathy without macular edema, right eye: Secondary | ICD-10-CM

## 2023-07-12 HISTORY — DX: Abnormal electrocardiogram (ECG) (EKG): R94.31

## 2023-07-12 HISTORY — DX: Transient cerebral ischemic attack, unspecified: G45.9

## 2023-07-12 HISTORY — DX: Personal history of transient ischemic attack (TIA), and cerebral infarction without residual deficits: Z86.73

## 2023-07-12 MED ORDER — REPATHA SURECLICK 140 MG/ML ~~LOC~~ SOAJ: 140.0000 mg | SUBCUTANEOUS | 2 refills | Status: DC

## 2023-07-12 MED ORDER — ASPIRIN 325 MG PO TBEC
325.0000 mg | DELAYED_RELEASE_TABLET | Freq: Every day | ORAL | Status: AC
Start: 2023-07-12 — End: ?

## 2023-07-12 MED ORDER — OZEMPIC (2 MG/DOSE) 8 MG/3ML ~~LOC~~ SOPN: 2.0000 mg | PEN_INJECTOR | SUBCUTANEOUS | 2 refills | Status: DC

## 2023-07-12 NOTE — Telephone Encounter (Signed)
 Pharmacy Patient Advocate Encounter   Received notification from CoverMyMeds that prior authorization for Repatha SureClick 140MG /ML auto-injectors  is required/requested.   Insurance verification completed.   The patient is insured through [employer] .   Per test claim: PA required; PA started via CoverMyMeds. KEY [id number] . Waiting for clinical questions to populate.

## 2023-07-12 NOTE — Assessment & Plan Note (Addendum)
 Obtain CT for further evaluation. Increase aspirin  from 81mg  to 325mg  for secondary prevention. Add repatha for hyperlipidemia. Obtain EKG Obtain carotid doppler/2D echo Refer to Neurology for further evaluation.

## 2023-07-12 NOTE — Assessment & Plan Note (Signed)
 Overall stable despite a lot of stress at home with her husband. Continue lexapro .

## 2023-07-12 NOTE — Progress Notes (Signed)
 Subjective:     Patient ID: Wendy Guzman, female    DOB: 07-19-1945, 78 y.o.   MRN: [id number]  Chief Complaint  Patient presents with   Trouble speaking    Patient reports having one episode of "trouble speaking for about 2 hours on 5 days ago"    HPI  Discussed the use of AI scribe software for clinical note transcription with the patient, who gave verbal consent to proceed.  History of Present Illness  Wendy Guzman is a 78 year old female with a history of mini strokes who presents today following an episode of speech difficulty which occurred on 5/17.  She plays a recording of a voicemail she left for her daughter during this episode which was notable for word finding difficulty. There was no slurring of speech noted on the recording. She reports that the speech difficulty lasted about two hours, characterized by an inability to find words. She looked in the mirror during this episode and noted no facial drooping. In addition she denied numbness, weakness, vision changes, or headache. This was the first occurrence, and it has not recurred. She is on aspirin  81 mg daily. She is statin intolerant but does take zetia .  Her husband's significant depression is a source of stress, affecting family dynamics. She states that he was on Wellbutrin without improvement and has discontinued it.  Her last A1c was 7.1. She is on Lexapro  for mood stabilization and is actively working on [employer].     Health Maintenance Due  Topic Date Due   DTaP/Tdap/Td (1 - Tdap) Never done   Medicare Annual Wellness (AWV)  08/07/2016   COVID-19 Vaccine (4 - 2024-25 season) 10/21/2022    Past Medical History:  Diagnosis Date   Colon polyps    Depression    Diabetes mellitus without complication (HCC)    Frequent headaches    History of chicken pox    Hyperlipidemia    Hypertension     Past Surgical History:  Procedure Laterality Date   TONSILLECTOMY  80s   TUBAL  LIGATION  1976    Family History  Problem Relation Age of Onset   COPD Mother    Aneurysm Mother        AAA   Cancer Father        lung   Diabetes Father    Diabetes Paternal Uncle    Hypertension Maternal Grandmother    Atrial fibrillation Brother    Diabetes Mellitus II Brother    Atrial fibrillation Brother    Diabetes Mellitus II Brother     Social History   Socioeconomic History   Marital status: Married    Spouse name: Not on file   Number of children: Not on file   Years of education: Not on file   Highest education level: Not on file  Occupational History   Not on file  Tobacco Use   Smoking status: Never   Smokeless tobacco: Never  Substance and Sexual Activity   Alcohol use: Yes    Alcohol/week: 2.0 - 4.0 standard drinks of alcohol    Types: 2 - 4 Glasses of wine per week   Drug use: No   Sexual activity: Not on file  Other Topics Concern   Not on file  Social History Narrative   Retired Runner, broadcasting/film/video and ten worked at the [employer]   Married   3 children, all are local ([place])   Enjoys reading, Clinical cytogeneticist, quilting/crocheting   Social Drivers  of Health   Financial Resource Strain: Not on file  Food Insecurity: Not on file  Transportation Needs: Not on file  Physical Activity: Not on file  Stress: Not on file  Social Connections: Not on file  Intimate Partner Violence: Not on file    Outpatient Medications Prior to Visit  Medication Sig Dispense Refill   alendronate  (FOSAMAX ) 70 MG tablet Take 1 tablet (70 mg total) by mouth every 7 (seven) days. Take with a full glass of water on an empty stomach. 12 tablet 4   ALPRAZolam  (XANAX ) 0.5 MG tablet Take 1 tablet (0.5 mg total) by mouth 3 (three) times daily as needed. 30 tablet 0   betamethasone  dipropionate (DIPROLENE ) 0.05 % cream Apply topically 2 (two) times daily. 30 g 0   Calcium Carbonate-Vit D-Min (CALTRATE 600+D PLUS MINERALS) 600-800 MG-UNIT TABS Take 1 tablet by mouth in the morning  and at bedtime.     Calcium Carbonate-Vitamin D 600-400 MG-UNIT tablet Take 1 tablet by mouth 2 (two) times daily.     Cholecalciferol (VITAMIN D3) 10000 units TABS Take 3 tablets by mouth daily.     escitalopram  (LEXAPRO ) 20 MG tablet TAKE 1 TABLET BY MOUTH EVERY DAY 90 tablet 1   ezetimibe  (ZETIA ) 10 MG tablet Take 1 tablet (10 mg total) by mouth daily. 90 tablet 1   fluticasone  (FLONASE ) 50 MCG/ACT nasal spray SPRAY 2 SPRAYS INTO EACH NOSTRIL EVERY DAY 48 mL 1   insulin degludec  (TRESIBA ) 100 UNIT/ML FlexTouch Pen Use as directed. Max daily dose is 50 units.     JARDIANCE 25 MG TABS tablet Take 25 mg by mouth daily.     losartan  (COZAAR ) 100 MG tablet Take 1 tablet (100 mg total) by mouth daily. 90 tablet 1   aspirin  EC 81 MG tablet Take 1 tablet (81 mg total) by mouth daily.     Semaglutide , 1 MG/DOSE, 4 MG/3ML SOPN Inject 1 mg as directed once a week.     No facility-administered medications prior to visit.    Allergies  Allergen Reactions   Ezetimibe -Simvastatin Other (See Comments)    Extreme muscle pain and weakness   Lovastatin Other (See Comments)    Extreme muscle pain and weakness   Rosuvastatin Other (See Comments)    Extreme muscle pain and weakness    ROS See hpi    Objective:     Physical Exam Constitutional:      General: She is not in acute distress.    Appearance: Normal appearance. She is well-developed.  HENT:     Head: Normocephalic and atraumatic.     Right Ear: External ear normal.     Left Ear: External ear normal.  Eyes:     General: No scleral icterus. Neck:     Thyroid : No thyromegaly.  Cardiovascular:     Rate and Rhythm: Normal rate and regular rhythm.     Heart sounds: Normal heart sounds. No murmur heard. Pulmonary:     Effort: Pulmonary effort is normal. No respiratory distress.     Breath sounds: Normal breath sounds. No wheezing.  Musculoskeletal:        General: No swelling.     Cervical back: Neck supple.  Skin:    General:  Skin is warm and dry.  Neurological:     General: No focal deficit present.     Mental Status: She is alert and oriented to person, place, and time. Mental status is at baseline.     Cranial Nerves:  No cranial nerve deficit.     Sensory: No sensory deficit.     Motor: No weakness.     Comments: Speech is clear  Psychiatric:        Mood and Affect: Mood normal.        Behavior: Behavior normal.        Thought Content: Thought content normal.        Judgment: Judgment normal.      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  But she is very concerned with her weight. Will increase ozempic  dose from 1 mg to 2 mg to assist with weight loss.       Relevant Medications   aspirin  EC 325 MG tablet   Evolocumab (REPATHA SURECLICK) 140 MG/ML SOAJ   Semaglutide , 2 MG/DOSE, (OZEMPIC , 2 MG/DOSE,) 8 MG/3ML SOPN   TIA (transient ischemic attack) - Primary   Obtain CT for further evaluation. Increase aspirin  from 81mg  to 325mg  for secondary prevention. Add repatha for hyperlipidemia. Obtain EKG Obtain carotid doppler/2D echo Refer to Neurology for further evaluation.       Relevant Medications   aspirin  EC 325 MG tablet   Evolocumab (REPATHA SURECLICK) 140 MG/ML SOAJ   Other Relevant Orders   CT HEAD WO CONTRAST ( )   US  Carotid Duplex Bilateral   ECHOCARDIOGRAM COMPLETE   Ambulatory referral to Neurology    EKG 12-Lead (Completed)   Hyperlipidemia   Statin intolerant. In lightly of likely recent TIA vs. CVA, will work on PA for Repatha. Goal LDL <70. Continue Zetia .       Relevant Medications   aspirin  EC 325 MG tablet   Evolocumab (REPATHA SURECLICK) 140 MG/ML SOAJ   Depression with anxiety   Overall stable despite a lot of stress at home with her husband. Continue lexapro .       Abnormal EKG   EKG personally reviewed- notes NSR.  No previous EKG on file for comparison.  ? Old apical infarct.  Refer to cardiology for further evaluation.       Relevant Orders   Ambulatory referral to Cardiology    I have discontinued Betti Browns. Krabbenhoft's aspirin  EC and Semaglutide  (1 MG/DOSE). I am also having her start on aspirin  EC, Repatha SureClick, and Ozempic  (2 MG/DOSE). Additionally, I am having her maintain her Vitamin D3, Calcium Carbonate-Vitamin D, betamethasone  dipropionate, Jardiance, insulin degludec , ezetimibe , ALPRAZolam , fluticasone , Caltrate 600+D Plus Minerals, losartan , escitalopram , and alendronate .  Meds ordered this encounter  Medications   aspirin  EC 325 MG tablet    Sig: Take 1 tablet (325 mg total) by mouth daily.    Supervising Provider:   Randie Bustle A [[id number]]   Evolocumab (REPATHA SURECLICK) 140 MG/ML SOAJ    Sig: Inject 140 mg into  the skin every 14 (fourteen) days.    Dispense:  2 mL    Refill:  2    Supervising Provider:   Randie Bustle A [[id number]]   Semaglutide , 2 MG/DOSE, (OZEMPIC , 2 MG/DOSE,) 8 MG/3ML SOPN    Sig: Inject 2 mg into the skin once a week.    Dispense:  3 mL    Refill:  2    Supervising Provider:   Randie Bustle A [[id number]]

## 2023-07-12 NOTE — Assessment & Plan Note (Signed)
 EKG personally reviewed- notes NSR.  No previous EKG on file for comparison.  ? Old apical infarct.  Refer to cardiology for further evaluation.

## 2023-07-12 NOTE — Assessment & Plan Note (Signed)
 Last A1C was nearly at goal at 7.1 last visit.  But she is very concerned with her weight. Will increase ozempic  dose from 1 mg to 2 mg to assist with weight loss.

## 2023-07-12 NOTE — Assessment & Plan Note (Signed)
 Statin intolerant. In lightly of likely recent TIA vs. CVA, will work on PA for Repatha. Goal LDL <70. Continue Zetia .

## 2023-07-12 NOTE — Telephone Encounter (Signed)
 Copied from CRM [id number]. Topic: Clinical - Medical Advice >> Jul 12, 2023 12:32 PM Wendy Guzman wrote: Reason for CRM: pt called to provide her provider with a names of a neurologist, name Tonuzi, at [facility] [phone]

## 2023-07-12 NOTE — Patient Instructions (Addendum)
 VISIT SUMMARY:  Today, we discussed your recent episode of speech difficulty, which may have been a transient ischemic attack (TIA). We also reviewed your diabetes, cholesterol, weight management, and the impact of your husband's depression on your stress levels.  YOUR PLAN:  TRANSIENT ISCHEMIC ATTACK (TIA): You had an episode of speech difficulty that may have been a TIA. -We will perform a full neurological exam. -We will order an EKG, carotid ultrasound, echocardiogram, and CT of the head. -Increase your aspirin  dose to 325 mg daily. -You will be referred to a neurologist for further evaluation.  HYPERLIPIDEMIA: Your LDL cholesterol is above the target level, and you have had muscle pain with statins. -We will start you on Repatha, pending prior authorization. -Continue taking Zetia . -We will check your cholesterol levels one month after starting Repatha.  TYPE 2 DIABETES MELLITUS: Your A1c has improved to 7.1, and we discussed weight management. -Increase your Ozempic  dose to 2 mg weekly. -Monitor for side effects such as nausea and constipation. -Continue monitoring your A1c levels.  OBESITY: Your weight impacts your diabetes management. -Increase your Ozempic  dose to 2 mg weekly. -Encourage dietary monitoring and physical activity.  DEPRESSION: Your mood is stable on Lexapro , but your husband's depression is affecting your stress levels. -Discuss with your husband the possibility of trying Prozac or a similar medication. -Consider a psychiatric referral for your husband, though he is resistant. -Continue taking Lexapro . -Consider increasing Ozempic  for weight management instead of changing mood medications.  ABNORMAL EKG: We will refer you to cardiology for further evaluation.

## 2023-07-16 ENCOUNTER — Ambulatory Visit (HOSPITAL_BASED_OUTPATIENT_CLINIC_OR_DEPARTMENT_OTHER)
Admission: RE | Admit: 2023-07-16 | Discharge: 2023-07-16 | Disposition: A | Discharge status: Home / Self Care | Source: Ambulatory Visit | Attending: Family | Admitting: Family

## 2023-07-16 DIAGNOSIS — G459 Transient cerebral ischemic attack, unspecified: Secondary | ICD-10-CM | POA: Insufficient documentation

## 2023-07-16 DIAGNOSIS — I6523 Occlusion and stenosis of bilateral carotid arteries: Secondary | ICD-10-CM | POA: Diagnosis not present

## 2023-07-16 NOTE — Telephone Encounter (Signed)
 PLEASE BE ADVISED Clinical questions have been answered and PA submitted.TO PLAN. PA currently Pending.

## 2023-07-17 ENCOUNTER — Encounter: Payer: Self-pay | Admitting: Neurology

## 2023-07-18 ENCOUNTER — Ambulatory Visit: Payer: Self-pay | Admitting: Family

## 2023-07-18 NOTE — Telephone Encounter (Signed)
 Pharmacy Patient Advocate Encounter  Received notification from [employer] that Prior Authorization for Repatha  SureClick 140MG /ML auto-injectors  has been APPROVED from 07/16/23 to 02/19/24   PA #/Case ID/Reference #: [id number]

## 2023-07-24 ENCOUNTER — Other Ambulatory Visit: Payer: Self-pay | Admitting: Family

## 2023-07-24 ENCOUNTER — Other Ambulatory Visit: Payer: Self-pay

## 2023-07-24 DIAGNOSIS — E119 Type 2 diabetes mellitus without complications: Secondary | ICD-10-CM | POA: Insufficient documentation

## 2023-07-24 DIAGNOSIS — Z8619 Personal history of other infectious and parasitic diseases: Secondary | ICD-10-CM | POA: Insufficient documentation

## 2023-07-24 DIAGNOSIS — K635 Polyp of colon: Secondary | ICD-10-CM | POA: Insufficient documentation

## 2023-07-24 DIAGNOSIS — F32A Depression, unspecified: Secondary | ICD-10-CM | POA: Insufficient documentation

## 2023-07-24 DIAGNOSIS — R519 Headache, unspecified: Secondary | ICD-10-CM | POA: Insufficient documentation

## 2023-07-24 DIAGNOSIS — I10 Essential (primary) hypertension: Secondary | ICD-10-CM

## 2023-07-25 ENCOUNTER — Ambulatory Visit

## 2023-07-25 ENCOUNTER — Other Ambulatory Visit: Payer: Self-pay

## 2023-07-25 VITALS — BP 130/76 | HR 83 | Ht 66.0 in | Wt 195.0 lb

## 2023-07-25 DIAGNOSIS — Z8249 Family history of ischemic heart disease and other diseases of the circulatory system: Secondary | ICD-10-CM | POA: Insufficient documentation

## 2023-07-25 DIAGNOSIS — I10 Essential (primary) hypertension: Secondary | ICD-10-CM

## 2023-07-25 DIAGNOSIS — R9431 Abnormal electrocardiogram [ECG] [EKG]: Secondary | ICD-10-CM

## 2023-07-25 DIAGNOSIS — E785 Hyperlipidemia, unspecified: Secondary | ICD-10-CM

## 2023-07-25 DIAGNOSIS — G459 Transient cerebral ischemic attack, unspecified: Secondary | ICD-10-CM | POA: Diagnosis not present

## 2023-07-25 NOTE — Assessment & Plan Note (Signed)
 Screen for abdominal aortic aneurysm with an ultrasound.

## 2023-07-25 NOTE — Assessment & Plan Note (Signed)
 Last lipid panel from March 25 with total cholesterol 171, HDL 60, LDL 96, triglycerides 72.  Continue pravastatin 40 mg once daily and Zetia  10 mg once daily.  She did not tolerate Lipitor or Crestor in the past due to muscle aches. PCP recently started Repatha  and she is yet to begin due to concerns about potential back pain side effects. Reviewed potential benefits and risks associated with Repatha . Reviewed local site injections, allergic reactions and other side effects.  She is agreeable to start. Target LDL below 70 mg/dL.

## 2023-07-25 NOTE — Assessment & Plan Note (Signed)
 Well-controlled. Continue current dose of losartan  100 mg/day. Target blood pressure below 130/80 mmHg.

## 2023-07-25 NOTE — Assessment & Plan Note (Addendum)
 Q waves were noted on EKG at PCPs office. Not clearly noted on today's EKG likely related to lead positioning. Reassured her about the EKG findings. She does not have any active cardiac symptoms. She does have significant cardiovascular risk factors.  Continue with aggressive cardiovascular risk factor reduction as noted above.  Will follow-up echocardiogram results once available. Given lack of any significant cardiac symptoms at this time we will hold off on functional testing. Advised her to notify us  if she has any symptoms of chest pain or shortness of breath.

## 2023-07-25 NOTE — Progress Notes (Signed)
 Cardiology Consultation:    Date:  07/25/2023   ID:  Wendy Guzman, DOB Sep 10, 1945, MRN [id number]  PCP:  Wendy Gaucher, NP  Cardiologist:  Wendy Kells, MD   Referring MD: Wendy Gaucher, NP   No chief complaint on file.     ASSESSMENT AND PLAN:   Ms. Single 78 year old woman history of diabetes mellitus, hyperlipidemia, depression, CKD stage II/III episode of transient word finding difficulty lasting for couple hours on May 17. She did not go to the ER. She had an visit with her PCP 07-12-2023 and is currently pending further evaluation with CT head, ultrasound carotids [completed 07-16-2023 showed minimal atherosclerotic changes in bilateral internal carotids and antegrade flow in bilateral vertebral arteries] and echocardiogram [scheduled for July 3] and pending visit with neurologist. In the setting of abnormal EKG findings noted with concern about possible old infarct at PCP office visit she was referred here for further evaluation. Denies any prior history of CAD, CHF, MI.  No prior echocardiogram or stress test. Has a family history of aortic aneurysm and complications related to that.  Here for further evaluation of abnormal EKG findings.  Problem List Items Addressed This Visit     HTN (hypertension)   Well-controlled. Continue current dose of losartan  100 mg/day. Target blood pressure below 130/80 mmHg.      Relevant Medications   pravastatin (PRAVACHOL) 40 MG tablet   Hyperlipidemia   Last lipid panel from March 25 with total cholesterol 171, HDL 60, LDL 96, triglycerides 72.  Continue pravastatin 40 mg once daily and Zetia  10 mg once daily.  She did not tolerate Lipitor or Crestor in the past due to muscle aches. PCP recently started Repatha  and she is yet to begin due to concerns about potential back pain side effects. Reviewed potential benefits and risks associated with Repatha . Reviewed local site injections, allergic reactions  and other side effects.  She is agreeable to start. Target LDL below 70 mg/dL.        Relevant Medications   pravastatin (PRAVACHOL) 40 MG tablet   TIA (transient ischemic attack)   Symptoms for word finding difficulty on May 17 which were transient concerning for TIA and PCP is completing the workup. Ultrasound carotids unremarkable. Echocardiogram pending scheduled for July 3. Will proceed with Zio patch monitor for 28 days to assess for any underlying atrial arrhythmias [she does have family history of A-fib] Neurology consult pending. Now on high-dose aspirin  325 mg once daily since recent PCP follow-up visit       Relevant Medications   pravastatin (PRAVACHOL) 40 MG tablet   Abnormal EKG - Primary   Q waves were noted on EKG at PCPs office. Not clearly noted on today's EKG likely related to lead positioning. Reassured her about the EKG findings. She does not have any active cardiac symptoms. She does have significant cardiovascular risk factors.  Continue with aggressive cardiovascular risk factor reduction as noted above.  Will follow-up echocardiogram results once available. Given lack of any significant cardiac symptoms at this time we will hold off on functional testing. Advised her to notify us  if she has any symptoms of chest pain or shortness of breath.       Relevant Orders   EKG 12-Lead (Completed)   Family history of abdominal aortic aneurysm   Screen for abdominal aortic aneurysm with an ultrasound.       Return to clinic tentatively based on test results.   History of Present Illness:  Wendy Guzman is a 78 y.o. female who is being seen today for the evaluation of abnormal EKG at the request of Wendy Gaucher, NP.  Here for the visit today by herself.  Lives at home with her husband.  Keeps herself busy with routine house.  But no regular exercise.  Most physical exertional activity and is lawnmowing and going up a flight of  stairs.  Has history of diabetes mellitus, hyperlipidemia, depression, CKD stage II/III episode of transient word finding difficulty lasting for couple hours on May 17. She did not go to the ER. She had an visit with her PCP 07-12-2023 and is currently pending further evaluation with CT head, ultrasound carotids [completed 07-16-2023 showed minimal atherosclerotic changes in bilateral internal carotids and antegrade flow in bilateral vertebral arteries] and echocardiogram [scheduled for July 3] and pending visit with neurologist. In the setting of abnormal EKG findings noted with concern about possible old infarct at PCP office visit she was referred here for further evaluation. Denies any prior history of CAD, CHF, MI.  No prior echocardiogram or stress test.  Mentions overall she is doing well.  The transient word finding difficulty on May 17 in the morning started suddenly and lasted for couple hours.  Was not associated with any other focal weakness or facial droop or visual changes.  No further recurrence of symptoms.  As she did not feel any significantly worse and feels she is generally healthy did not call 911 or go to the ER.  She denies any chest pain, shortness of breath, orthopnea, paroxysmal nocturnal dyspnea. No pedal edema. No lightheadedness, dizziness or syncopal episodes.  At second and passive exposure to smoking as a child from her parents. Rare social alcohol consumption. No illicit drug use.  Takes medications consistently. Did not tolerate Crestor and Lipitor in the past.  Has been consistently on pravastatin and Zetia  for multiple years at the current dose. PCP recently prescribed Repatha  which she is yet to start taking as she was concerned about potential side effects of back pain.  Gives family history of aneurysm and fractures in multiple family members.  EKG in clinic today shows sinus rhythm with heart rate 83/min, PR interval 194 ms, left axis deviation with QRS  duration 86 ms.  No significant ST-T changes to suggest ischemia. Prior EKG from PCPs office noted sinus rhythm with inferior Q waves.  Blood work from 05-15-2023 hemoglobin A1c 7.1.  BUN 15, creatinine 0.94 and EGFR 59 Lipid panel total.  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 Take with a full glass of water on an empty stomach.  ALPRAZolam  (XANAX ) 0.5 MG tablet Take 1 tablet (0.5 mg total) by mouth 3 (three) times daily as needed.   aspirin  EC 325 MG tablet Take 1 tablet (325 mg total) by mouth daily.   betamethasone  dipropionate (DIPROLENE ) 0.05 % cream Apply topically 2 (two) times daily.   Calcium Carbonate-Vit D-Min (CALTRATE 600+D PLUS MINERALS) 600-800 MG-UNIT TABS Take 1 tablet by mouth in the morning and at bedtime.   escitalopram  (LEXAPRO ) 20 MG tablet TAKE 1 TABLET BY MOUTH EVERY DAY   Evolocumab  (REPATHA  SURECLICK) 140 MG/ML SOAJ Inject 140 mg into the skin every 14 (fourteen) days.   ezetimibe  (ZETIA ) 10 MG tablet Take 1 tablet (10 mg total) by mouth daily.   fluticasone  (FLONASE ) 50 MCG/ACT nasal spray SPRAY 2 SPRAYS INTO EACH NOSTRIL EVERY  DAY   insulin degludec  (TRESIBA ) 100 UNIT/ML FlexTouch Pen Inject 0-50 Units into the skin as directed.   JARDIANCE 25 MG TABS tablet Take 25 mg by mouth daily.   losartan  (COZAAR ) 100 MG tablet TAKE 1 TABLET BY MOUTH EVERY DAY   pravastatin (PRAVACHOL) 40 MG tablet Take 40 mg by mouth daily.   Semaglutide , 2 MG/DOSE, (OZEMPIC , 2 MG/DOSE,) 8 MG/3ML SOPN Inject 2 mg into the skin once a week.     Allergies:   Ezetimibe -simvastatin, Lovastatin, and Rosuvastatin   Social History   Socioeconomic History   Marital status: Married    Spouse name: Not on file   Number of children: Not on file   Years of education: Not on file   Highest education level: Not on file  Occupational History   Not on file  Tobacco Use   Smoking status: Never   Smokeless tobacco: Never  Substance and Sexual Activity   Alcohol use: Yes    Alcohol/week: 2.0 - 4.0 standard drinks of alcohol    Types: 2 - 4 Glasses of wine per week   Drug use: No   Sexual activity: Not on file  Other Topics Concern   Not on file  Social History Narrative   Retired Runner, broadcasting/film/video and ten worked at the [employer]   3 children, all are Designer, multimedia ([place]   Enjoys reading, Clinical cytogeneticist, quilting/crocheting   Social Drivers of Corporate investment banker Strain: Not on file  Food Insecurity: Not on file  Transportation Needs: Not on file  Physical Activity: Not on file  Stress: Not on file  Social Connections: Not on file     Family History: The patient's family history includes Aneurysm in her mother; Atrial fibrillation in her brother and brother; COPD in her mother; Cancer in her father; Diabetes in her father and paternal uncle; Diabetes Mellitus II in her brother and brother; Hypertension in her maternal grandmother. ROS:   Please see the history of present illness.    All 14 point review of systems negative except as described per history of present illness.  EKGs/Labs/Other Studies Reviewed:    The  following studies were reviewed today:   EKG:  EKG Interpretation Date/Time:  Thursday July 25 2023 08:19:42 EDT Ventricular Rate:  83 PR Interval:  194 QRS Duration:  86 QT Interval:  394 QTC Calculation: 462 R Axis:   -60  Text Interpretation: Normal sinus rhythm Left axis deviation Nonspecific ST and T wave abnormality No previous ECGs available Confirmed by Bertha Broad reddy [phone]) on 07/25/2023 8:28:20 AM    Prior EKG from PCP's office:   Recent Labs: 05/15/2023: ALT 25; BUN 15; Creatinine, Ser 0.94; Potassium  4.0; Sodium 140  Recent Lipid Panel    Component Value Date/Time   CHOL 171 05/15/2023 0848   TRIG 72.0 05/15/2023 0848   HDL 60.30 05/15/2023 0848   CHOLHDL 3 05/15/2023 0848   VLDL 14.4 05/15/2023 0848   LDLCALC 96 05/15/2023 0848    Physical Exam:    VS:  BP 130/76   Pulse 83   Ht 5\' 6"  (1.676 m)   Wt 195 lb (88.5 kg)   SpO2 94%   BMI 31.47 kg/m     Wt Readings from Last 3 Encounters:  07/25/23 195 lb (88.5 kg)  07/12/23 199 lb (90.3 kg)  05/15/23 198 lb (89.8 kg)     GENERAL:  Well nourished, well developed in no acute distress NECK: No JVD; No carotid bruits CARDIAC: RRR, S1 and S2 present, no murmurs, no rubs, no gallops CHEST:  Clear to auscultation without rales, wheezing or rhonchi  Extremities: No pitting pedal edema. Pulses bilaterally symmetric with radial 2+ and dorsalis pedis 2+ NEUROLOGIC:  Alert and oriented x 3  Medication Adjustments/Labs and Tests Ordered: Current medicines are reviewed at length with the patient today.  Concerns regarding medicines are outlined above.  Orders Placed This Encounter  Procedures   EKG 12-Lead   No orders of the defined types were placed in this encounter.   Signed, Lura Sallies, MD, MPH, [facility]. 07/25/2023 8:50 AM    [facility]

## 2023-07-25 NOTE — Patient Instructions (Signed)
 Medication Instructions:  Your physician recommends that you continue on your current medications as directed. Please refer to the Current Medication list given to you today.  *If you need a refill on your cardiac medications before your next appointment, please call your pharmacy*  Lab Work: None If you have labs (blood work) drawn today and your tests are completely normal, you will receive your results only by: MyChart Message (if you have MyChart) OR A paper copy in the mail If you have any lab test that is abnormal or we need to change your treatment, we will call you to review the results.  Testing/Procedures: A zio monitor was ordered today. It will remain on for 28 days. You will then return monitor and event diary in provided box. It takes 1-2 weeks for report to be downloaded and returned to us . We will call you with the results. If monitor falls off or has orange flashing light, please call Zio for further instructions.   Your physician has requested that you have an abdominal aorta duplex. During this test, an ultrasound is used to evaluate the aorta. Allow 30 minutes for this exam. Do not eat after midnight the day before and avoid carbonated beverages.  Please note: We ask at that you not bring children with you during ultrasound (echo/ vascular) testing. Due to room size and safety concerns, children are not allowed in the ultrasound rooms during exams. Our front office staff cannot provide observation of children in our lobby area while testing is being conducted. An adult accompanying a patient to their appointment will only be allowed in the ultrasound room at the discretion of the ultrasound technician under special circumstances. We apologize for any inconvenience.    Follow-Up: At [facility], you and your health needs are our priority.  As part of our continuing mission to provide you with exceptional heart care, our providers are all part of one team.  This team  includes your primary Cardiologist (physician) and Advanced Practice Providers or APPs (Physician Assistants and Nurse Practitioners) who all work together to provide you with the care you need, when you need it.  Your next appointment:   Follow up to be determined based on test results.  Provider:   Bertha Broad, MD    We recommend signing up for the patient portal called "MyChart".  Sign up information is provided on this After Visit Summary.  MyChart is used to connect with patients for Virtual Visits (Telemedicine).  Patients are able to view lab/test results, encounter notes, upcoming appointments, etc.  Non-urgent messages can be sent to your provider as well.   To learn more about what you can do with MyChart, go to [email or web address].   Other Instructions None

## 2023-07-25 NOTE — Assessment & Plan Note (Addendum)
 Symptoms for word finding difficulty on May 17 which were transient concerning for TIA and PCP is completing the workup. Ultrasound carotids unremarkable. Echocardiogram pending scheduled for July 3. Will proceed with Zio patch monitor for 28 days to assess for any underlying atrial arrhythmias [she does have family history of A-fib] Neurology consult pending. Now on high-dose aspirin  325 mg once daily since recent PCP follow-up visit

## 2023-07-30 NOTE — Progress Notes (Unsigned)
 NEUROLOGY CONSULTATION NOTE  Wendy Guzman MRN: [id number] DOB: 27-Sep-1945  Referring provider: Dorrene Gaucher, NP Primary care provider: Dorrene Gaucher, NP  Reason for consult:  TIA  Assessment/Plan:   Concern for left hemispheric TIA vs stroke Hypertension Hyperlipidemia Type 2 diabetes mellitus.   Check CT head now followed by MRI of brain without contrast Pending CT head results, will plan to change from ASA to Plavix 75mg  daily. Normotensive blood pressure LDL goal less than 70.  Continue Zetia .  Repatha  has been initiated.   Hgb A1c goal less than 7.  Ozempic  has been increased. Mediterranean diet Routine exercise Follow up ***   Subjective:  Wendy Guzman is a 78 year old  ***-handed female with HTN, DM 2, HLD and prior history of TIAs who presents for transient ischemic attack.  History supplemented by referring provider's note.    On 07/06/2023, she developed speech difficulty.  Her speech was hesitant due to word-finding difficulty.  No slurred speech, facial droop, headache, visual changes or unilateral numbness or weakness.  Symptoms lasted about 2 hours.  At the time she was on ASA 81mg .  She has statin intolerance and takes Zetia .  She has prior history of TIAs ***  Previous labs from 05/15/2023 revealed Hgb A1c 7.1 and LDL 96.     She followed up with her PCP who started Repatha , increased ASA to 325mg  and increased Ozempic .  Stroke work up has been initiated.  Carotid ultrasound on 07/16/2023 revealed just minimal atherosclerosis with less than 50% stenosis bilaterally.  ECG on 5/23 and 07/25/2023 both revealed NSR.  She has just started ambulatory cardiac event monitor and echocardiogram is scheduled for 08/22/2023.     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 Take with a full glass of water on an empty stomach. 12 tablet 4   ALPRAZolam  (XANAX ) 0.5 MG tablet Take 1 tablet (0.5 mg total) by mouth 3 (three) times daily as needed. 30 tablet 0   aspirin  EC 325 MG tablet Take 1 tablet (325 mg total) by mouth daily.     betamethasone  dipropionate (DIPROLENE ) 0.05 % cream Apply topically 2 (two) times daily. 30 g 0   Calcium Carbonate-Vit D-Min (CALTRATE 600+D PLUS MINERALS) 600-800 MG-UNIT TABS Take 1 tablet by mouth in the morning and at bedtime.     escitalopram  (LEXAPRO ) 20 MG tablet TAKE 1 TABLET BY MOUTH EVERY DAY 90 tablet 1   Evolocumab  (REPATHA  SURECLICK) 140 MG/ML SOAJ Inject 140 mg into the skin every 14 (fourteen) days. 2 mL 2   ezetimibe  (ZETIA ) 10 MG tablet  Take 1 tablet (10 mg total) by mouth daily. 90 tablet 1   fluticasone  (FLONASE ) 50 MCG/ACT nasal spray SPRAY 2 SPRAYS INTO EACH NOSTRIL EVERY DAY 48 mL 1   insulin degludec  (TRESIBA ) 100 UNIT/ML FlexTouch Pen Inject 0-50 Units into the skin as directed.     JARDIANCE 25 MG TABS tablet Take 25 mg by mouth daily.     losartan  (COZAAR ) 100 MG tablet TAKE 1 TABLET BY MOUTH EVERY DAY 90 tablet 1   pravastatin (PRAVACHOL) 40 MG tablet Take 40 mg by mouth daily.     Semaglutide , 2  MG/DOSE, (OZEMPIC , 2 MG/DOSE,) 8 MG/3ML SOPN Inject 2 mg into the skin once a week. 3 mL 2   No current facility-administered medications on file prior to visit.    ALLERGIES: Allergies  Allergen Reactions   Ezetimibe -Simvastatin Other (See Comments)    Extreme muscle pain and weakness   Lovastatin Other (See Comments)    Extreme muscle pain and weakness   Rosuvastatin Other (See Comments)    Extreme muscle pain and weakness    FAMILY HISTORY: Family History  Problem Relation Age of Onset   COPD Mother    Aneurysm Mother        AAA   Cancer Father        lung   Diabetes Father    Diabetes Paternal Uncle    Hypertension Maternal Grandmother    Atrial fibrillation Brother    Diabetes Mellitus II Brother    Atrial fibrillation Brother    Diabetes Mellitus II Brother     Objective:  *** General: No acute distress.  Patient appears well-groomed.   Head:  Normocephalic/atraumatic Eyes:  fundi examined but not visualized Neck: supple, no paraspinal tenderness, full range of motion Heart: regular rate and rhythm Vascular: No carotid bruits. Neurological Exam: Mental status: alert and oriented to person, place, and time, speech fluent and not dysarthric, language intact. Cranial nerves: CN I: not tested CN II: pupils equal, round and reactive to light, visual fields intact CN III, IV, VI:  full range of motion, no nystagmus, no ptosis CN V: facial sensation intact. CN VII: upper and lower face symmetric CN VIII: hearing intact CN IX, X: gag intact, uvula midline CN XI: sternocleidomastoid and trapezius muscles intact CN XII: tongue midline Bulk & Tone: normal, no fasciculations. Motor:  muscle strength 5/5 throughout Sensation:  Pinprick and vibratory sensation intact. Deep Tendon Reflexes:  2+ throughout,  toes downgoing.   Finger to nose testing:  Without dysmetria.   Gait:  Normal station and stride.  Romberg negative.    Thank you for allowing me to take part  in the care of this patient.  Janne Members, DO  CC: Dorrene Gaucher, NP

## 2023-07-31 ENCOUNTER — Encounter: Payer: Self-pay | Admitting: Neurology

## 2023-07-31 ENCOUNTER — Ambulatory Visit: Admitting: Neurology

## 2023-07-31 VITALS — BP 132/77 | HR 91 | Ht 66.0 in | Wt 196.0 lb

## 2023-07-31 DIAGNOSIS — E785 Hyperlipidemia, unspecified: Secondary | ICD-10-CM

## 2023-07-31 DIAGNOSIS — E113291 Type 2 diabetes mellitus with mild nonproliferative diabetic retinopathy without macular edema, right eye: Secondary | ICD-10-CM | POA: Diagnosis not present

## 2023-07-31 DIAGNOSIS — G459 Transient cerebral ischemic attack, unspecified: Secondary | ICD-10-CM

## 2023-07-31 DIAGNOSIS — I10 Essential (primary) hypertension: Secondary | ICD-10-CM

## 2023-07-31 MED ORDER — DIAZEPAM 5 MG PO TABS: ORAL_TABLET | ORAL | 0 refills | Status: DC

## 2023-07-31 NOTE — Addendum Note (Signed)
 Addended by: Michalene Agee on: 07/31/2023 08:25 AM   Modules accepted: Orders

## 2023-07-31 NOTE — Patient Instructions (Addendum)
 Check MRI and MRA of head.  Will provide you diazepam to take.  Take 1 tablet 30-40 minutes prior to MRI and may repeat at MRI facility if needed.  Must have a driver.  Do not take with Xanax  Continue aspirin  325mg  daily for now.  Pending CT results, likely will change you to a different blood thinner called clopidogrel (Plavix) Continue Zetia , Repatha , Ozempic  Echocardiogram scheduled With cardiac monitor Mediterranean diet Routine exercise Follow up 6 months.   Mediterranean Diet A Mediterranean diet is based on the traditions of countries on the [employer]. It focuses on eating more: Fruits and vegetables. Whole grains, beans, nuts, and seeds. Heart-healthy fats. These are fats that are good for your heart. It involves eating less: Dairy. Meat and eggs. Processed foods with added sugar, salt, and fat. This type of diet can help prevent certain conditions. It can also improve outcomes if you have a long-term (chronic) disease, such as kidney or heart disease. What are tips for following this plan? Reading food labels Check packaged foods for: The serving size. For foods such as rice and pasta, the serving size is the amount of cooked product, not dry. The total fat. Avoid foods with saturated fat or trans fat. Added sugars, such as corn syrup. Shopping  Try to have a balanced diet. Buy a variety of foods, such as: Fresh fruits and vegetables. You may be able to get these from local farmers markets. You can also buy them frozen. Grains, beans, nuts, and seeds. Some of these can be bought in bulk. Fresh seafood. Poultry and eggs. Low-fat dairy products. Buy whole ingredients instead of foods that have already been packaged. If you can't get fresh seafood, buy precooked frozen shrimp or canned fish, such as tuna, salmon, or sardines. Stock your pantry so you always have certain foods on hand, such as olive oil, canned tuna, canned tomatoes, rice, pasta, and  beans. Cooking Cook foods with extra-virgin olive oil instead of using butter or other vegetable oils. Have meat as a side dish. Have vegetables or grains as your main dish. This means having meat in small portions or adding small amounts of meat to foods like pasta or stew. Use beans or vegetables instead of meat in common dishes like chili or lasagna. Try out different cooking methods. Try roasting, broiling, steaming, and sauting vegetables. Add frozen vegetables to soups, stews, pasta, or rice. Add nuts or seeds for added healthy fats and plant protein at each meal. You can add these to yogurt, salads, or vegetable dishes. Marinate fish or vegetables using olive oil, lemon juice, garlic, and fresh herbs. Meal planning Plan to eat a vegetarian meal one day each week. Try to work up to two vegetarian meals, if possible. Eat seafood two or more times a week. Have healthy snacks on hand. These may include: Vegetable sticks with hummus. Greek yogurt. Fruit and nut trail mix. Eat balanced meals. These should include: Fruit: 2-3 servings a day. Vegetables: 4-5 servings a day. Low-fat dairy: 2 servings a day. Fish, poultry, or lean meat: 1 serving a day. Beans and legumes: 2 or more servings a week. Nuts and seeds: 1-2 servings a day. Whole grains: 6-8 servings a day. Extra-virgin olive oil: 3-4 servings a day. Limit red meat and sweets to just a few servings a month. Lifestyle  Try to cook and eat meals with your family. Drink enough fluid to keep your pee (urine) pale yellow. Be active every day. This includes: Aerobic exercise, which  is exercise that causes your heart to beat faster. Examples include running and swimming. Leisure activities like gardening, walking, or housework. Get 7-8 hours of sleep each night. Drink red wine if your provider says you can. A glass of wine is 5 oz (150 mL). You may be allowed to have: Up to 1 glass a day if you're female and not pregnant. Up to 2  glasses a day if you're female. What foods should I eat? Fruits Apples. Apricots. Avocado. Berries. Bananas. Cherries. Dates. Figs. Grapes. Lemons. Melon. Oranges. Peaches. Plums. Pomegranate. Vegetables Artichokes. Beets. Broccoli. Cabbage. Carrots. Eggplant. Green beans. Chard. Kale. Spinach. Onions. Leeks. Peas. Squash. Tomatoes. Peppers. Radishes. Grains Whole-grain pasta. Brown rice. Bulgur wheat. Polenta. Couscous. Whole-wheat bread. Dwyane Glad. Meats and other proteins Beans. Almonds. Sunflower seeds. Pine nuts. Peanuts. Cod. Salmon. Scallops. Shrimp. Tuna. Tilapia. Clams. Oysters. Eggs. Chicken or [place] without skin. Dairy Low-fat milk. Cheese. Greek yogurt. Fats and oils Extra-virgin olive oil. Avocado oil. Grapeseed oil. Beverages Water. Red wine. Herbal tea. Sweets and desserts Greek yogurt with honey. Baked apples. Poached pears. Trail mix. Seasonings and condiments Basil. Cilantro. Coriander. Cumin. Mint. Parsley. Sage. Rosemary. Tarragon. Garlic. Oregano. Thyme. Pepper. Balsamic vinegar. Tahini. Hummus. Tomato sauce. Olives. Mushrooms. The items listed above may not be all the foods and drinks you can have. Talk to a dietitian to learn more. What foods should I limit? This is a list of foods that should be eaten rarely. Fruits Fruit canned in syrup. Vegetables Deep-fried potatoes, like [place] fries. Grains Packaged pasta or rice dishes. Cereal with added sugar. Snacks with added sugar. Meats and other proteins Beef. Pork. Lamb. Chicken or [place] with skin. Hot dogs. Helene Loader. Dairy Ice cream. Sour cream. Whole milk. Fats and oils Butter. Canola oil. Vegetable oil. Beef fat (tallow). Lard. Beverages Juice. Sugar-sweetened soft drinks. Beer. Liquor and spirits. Sweets and desserts Cookies. Cakes. Pies. Candy. Seasonings and condiments Mayonnaise. Pre-made sauces and marinades. The items listed above may not be all the foods and drinks you should limit. Talk to a  dietitian to learn more. Where to find more information American Heart Association (AHA): heart.org This information is not intended to replace advice given to you by your health care provider. Make sure you discuss any questions you have with your health care provider. Document Revised: 05/20/2022 Document Reviewed: 05/20/2022 Elsevier Patient Education  2024 [employer].

## 2023-08-02 ENCOUNTER — Telehealth: Payer: Self-pay

## 2023-08-02 NOTE — Telephone Encounter (Signed)
 LMOVM MRI/MRA head approved through [facility].  MRI Brain Tracking [id number] 08/01/23-09/30/2023    MRA Head tracking [id number] 08/01/23-09/30/23

## 2023-08-05 ENCOUNTER — Telehealth (HOSPITAL_BASED_OUTPATIENT_CLINIC_OR_DEPARTMENT_OTHER): Payer: Self-pay

## 2023-08-06 ENCOUNTER — Ambulatory Visit: Payer: Medicare PPO | Admitting: Family

## 2023-08-07 ENCOUNTER — Ambulatory Visit

## 2023-08-07 DIAGNOSIS — G459 Transient cerebral ischemic attack, unspecified: Secondary | ICD-10-CM

## 2023-08-07 DIAGNOSIS — I10 Essential (primary) hypertension: Secondary | ICD-10-CM

## 2023-08-07 DIAGNOSIS — E785 Hyperlipidemia, unspecified: Secondary | ICD-10-CM

## 2023-08-07 DIAGNOSIS — Z8249 Family history of ischemic heart disease and other diseases of the circulatory system: Secondary | ICD-10-CM

## 2023-08-07 DIAGNOSIS — R9431 Abnormal electrocardiogram [ECG] [EKG]: Secondary | ICD-10-CM

## 2023-08-09 ENCOUNTER — Ambulatory Visit (HOSPITAL_BASED_OUTPATIENT_CLINIC_OR_DEPARTMENT_OTHER)
Admission: RE | Admit: 2023-08-09 | Discharge: 2023-08-09 | Disposition: A | Discharge status: Home / Self Care | Source: Ambulatory Visit | Attending: Family | Admitting: Family

## 2023-08-09 DIAGNOSIS — I672 Cerebral atherosclerosis: Secondary | ICD-10-CM | POA: Diagnosis not present

## 2023-08-09 DIAGNOSIS — G459 Transient cerebral ischemic attack, unspecified: Secondary | ICD-10-CM | POA: Insufficient documentation

## 2023-08-09 DIAGNOSIS — I6523 Occlusion and stenosis of bilateral carotid arteries: Secondary | ICD-10-CM | POA: Diagnosis not present

## 2023-08-13 ENCOUNTER — Telehealth (HOSPITAL_BASED_OUTPATIENT_CLINIC_OR_DEPARTMENT_OTHER): Payer: Self-pay

## 2023-08-14 ENCOUNTER — Ambulatory Visit (HOSPITAL_BASED_OUTPATIENT_CLINIC_OR_DEPARTMENT_OTHER)

## 2023-08-15 ENCOUNTER — Ambulatory Visit (HOSPITAL_BASED_OUTPATIENT_CLINIC_OR_DEPARTMENT_OTHER)
Admission: RE | Admit: 2023-08-15 | Discharge: 2023-08-15 | Disposition: A | Discharge status: Home / Self Care | Source: Ambulatory Visit | Attending: Family | Admitting: Family

## 2023-08-15 ENCOUNTER — Other Ambulatory Visit: Payer: Self-pay

## 2023-08-15 DIAGNOSIS — R9431 Abnormal electrocardiogram [ECG] [EKG]: Secondary | ICD-10-CM

## 2023-08-15 DIAGNOSIS — E785 Hyperlipidemia, unspecified: Secondary | ICD-10-CM

## 2023-08-15 DIAGNOSIS — I10 Essential (primary) hypertension: Secondary | ICD-10-CM

## 2023-08-15 DIAGNOSIS — G459 Transient cerebral ischemic attack, unspecified: Secondary | ICD-10-CM | POA: Insufficient documentation

## 2023-08-15 DIAGNOSIS — Z8249 Family history of ischemic heart disease and other diseases of the circulatory system: Secondary | ICD-10-CM

## 2023-08-17 LAB — ECHOCARDIOGRAM COMPLETE
AR max vel: 2.93 cm2
AV Area VTI: 2.79 cm2
AV Area mean vel: 2.92 cm2
AV Mean grad: 1.5 mmHg
AV Peak grad: 2.6 mmHg
Ao pk vel: 0.81 m/s
Area-P 1/2: 3.42 cm2
Calc EF: 65 %
S' Lateral: 2.4 cm
Single Plane A2C EF: 61.1 %
Single Plane A4C EF: 70.2 %

## 2023-08-21 ENCOUNTER — Telehealth: Payer: Self-pay | Admitting: Family

## 2023-08-21 NOTE — Telephone Encounter (Signed)
 Please advise pt that CT head shows that she did suffer a stroke on the left side.  I would like her to follow through with schedule MRI and follow up with Dr. Skeet.    Was she able to get Repatha  injections for her cholesterol?

## 2023-08-21 NOTE — Telephone Encounter (Signed)
 Called patient but no answer, left voice mail for patient to call back.

## 2023-08-22 ENCOUNTER — Ambulatory Visit (HOSPITAL_BASED_OUTPATIENT_CLINIC_OR_DEPARTMENT_OTHER)

## 2023-08-22 ENCOUNTER — Ambulatory Visit (HOSPITAL_COMMUNITY)
Admission: RE | Admit: 2023-08-22 | Discharge: 2023-08-22 | Disposition: A | Discharge status: Home / Self Care | Source: Ambulatory Visit | Attending: Neurology | Admitting: Neurology

## 2023-08-22 DIAGNOSIS — G9389 Other specified disorders of brain: Secondary | ICD-10-CM | POA: Diagnosis not present

## 2023-08-22 DIAGNOSIS — R9082 White matter disease, unspecified: Secondary | ICD-10-CM | POA: Diagnosis not present

## 2023-08-22 DIAGNOSIS — G459 Transient cerebral ischemic attack, unspecified: Secondary | ICD-10-CM

## 2023-08-22 DIAGNOSIS — G319 Degenerative disease of nervous system, unspecified: Secondary | ICD-10-CM | POA: Diagnosis not present

## 2023-08-22 NOTE — Telephone Encounter (Signed)
 Patient has an appointment 08/27/23 at 9 am

## 2023-08-22 NOTE — Telephone Encounter (Signed)
 Called home, cell number and husband's number a few more times still no answer

## 2023-08-25 ENCOUNTER — Ambulatory Visit: Payer: Self-pay | Admitting: Neurology

## 2023-08-27 ENCOUNTER — Encounter: Payer: Self-pay | Admitting: Family

## 2023-08-27 ENCOUNTER — Ambulatory Visit: Admitting: Family

## 2023-08-27 VITALS — BP 118/65 | HR 80 | Temp 98.4°F | Wt 194.6 lb

## 2023-08-27 DIAGNOSIS — Z7984 Long term (current) use of oral hypoglycemic drugs: Secondary | ICD-10-CM | POA: Diagnosis not present

## 2023-08-27 DIAGNOSIS — F418 Other specified anxiety disorders: Secondary | ICD-10-CM

## 2023-08-27 DIAGNOSIS — E785 Hyperlipidemia, unspecified: Secondary | ICD-10-CM

## 2023-08-27 DIAGNOSIS — E113291 Type 2 diabetes mellitus with mild nonproliferative diabetic retinopathy without macular edema, right eye: Secondary | ICD-10-CM

## 2023-08-27 DIAGNOSIS — M810 Age-related osteoporosis without current pathological fracture: Secondary | ICD-10-CM | POA: Diagnosis not present

## 2023-08-27 DIAGNOSIS — Z8673 Personal history of transient ischemic attack (TIA), and cerebral infarction without residual deficits: Secondary | ICD-10-CM

## 2023-08-27 LAB — LIPID PANEL
Cholesterol: 101 mg/dL (ref <200)
HDL: 65 mg/dL (ref >=50)
LDL Cholesterol (Calc): 20 mg/dL
Non-HDL Cholesterol (Calc): 36 mg/dL (ref <130)
Total CHOL/HDL Ratio: 1.6 (calc) (ref <5.0)
Triglycerides: 77 mg/dL (ref <150)

## 2023-08-27 LAB — BASIC METABOLIC PANEL WITH GFR
BUN: 17 mg/dL (ref 6–23)
CO2: 23 meq/L (ref 19–32)
Calcium: 10.1 mg/dL (ref 8.4–10.5)
Chloride: 103 meq/L (ref 96–112)
Creatinine, Ser: 1.11 mg/dL (ref 0.40–1.20)
GFR: 48.22 mL/min — ABNORMAL LOW (ref >60.00)
Glucose, Bld: 98 mg/dL (ref 70–99)
Potassium: 4.6 meq/L (ref 3.5–5.1)
Sodium: 136 meq/L (ref 135–145)

## 2023-08-27 LAB — HEMOGLOBIN A1C: Hgb A1c MFr Bld: 6.6 % — ABNORMAL HIGH (ref 4.6–6.5)

## 2023-08-27 LAB — MICROALBUMIN / CREATININE URINE RATIO
Creatinine,U: 128 mg/dL
Microalb Creat Ratio: 28.4 mg/g (ref 0.0–30.0)
Microalb, Ur: 3.6 mg/dL — ABNORMAL HIGH (ref 0.0–1.9)

## 2023-08-27 NOTE — Progress Notes (Signed)
 Subjective:     Patient ID: Wendy Guzman, female    DOB: Nov 17, 1945, 78 y.o.   MRN: [id number]  Chief Complaint  Patient presents with   Follow-up    HPI  Discussed the use of AI scribe software for clinical note transcription with the patient, who gave verbal consent to proceed.  History of Present Illness Wendy Guzman is a 78 year old female with a history of stroke who presents for follow-up on her recent diagnostic workup and medication management.  CT head noted old left cerebellar infarct and findings of chronic small vessel disease.  MRI Brain noted:   Mild age-related change without acute intracranial pathology.Small chronic infarct in the right occipital lobe and the left cerebellum.  MRA was normal.  She experienced word-finding difficulty, leading to a CT scan that revealed a prior stroke. An MRI confirmed a stroke in the right occipital region and left cerebellum. An MRA showed no arterial blockages.  She met with Dr. Skeet, neurology as well as cardiology, Dr. Liborio since last visit.   An echocardiogram showed a strong heart with an ejection fraction of 55-60% and normal valves. A carotid ultrasound noted:  Minimal atherosclerotic changes with estimated stenosis of less than 50% bilaterally  She started Repatha  (after discussing side effects with cardiology), as she did not tolerate Lipitor and Crestor due to muscle issues. She reports no side effects from Repatha  injections.  Her last A1c was 7.1. She takes Ozempic  2 mg weekly for diabetes, which helps curb her appetite. She is also on Lexapro  and takes Fosamax  weekly, ensuring she remains upright after ingestion. No mood concerns on Lexapro .  No additional episodes of speech difficulties.      Health Maintenance Due  Topic Date Due   Diabetic kidney evaluation - Urine ACR  Never done   DTaP/Tdap/Td (1 - Tdap) Never done   Medicare Annual Wellness (AWV)  08/07/2016   COVID-19  Vaccine (4 - 2024-25 season) 10/21/2022   OPHTHALMOLOGY EXAM  08/07/2023    Past Medical History:  Diagnosis Date   Abnormal EKG 07/12/2023   Allergic rhinitis 05/15/2023   Colon polyps    Depression    Depression with anxiety 02/06/2016   Diabetes mellitus without complication (HCC)    Frequent headaches    History of chicken pox    HTN (hypertension) 02/06/2016   Hyperlipidemia    Osteoporosis without current pathological fracture 11/27/2022   TIA (transient ischemic attack) 07/12/2023   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, without long-term current use of insulin (HCC) 02/06/2016    Past Surgical History:  Procedure Laterality Date   TONSILLECTOMY  1950s   TUBAL LIGATION  1976    Family History  Problem Relation Age of Onset   COPD Mother    Aneurysm Mother        AAA   Cancer Father        lung   Diabetes Father    Atrial fibrillation Brother    Diabetes Mellitus II Brother    Atrial fibrillation Brother    Diabetes Mellitus II Brother    Aneurysm Maternal Uncle    Aneurysm Maternal Uncle    Diabetes Paternal Uncle    Hypertension Maternal Grandmother     Social History   Socioeconomic History   Marital status: Married    Spouse name: Not on file   Number of children: Not on file   Years of education:  Not on file   Highest education level: Not on file  Occupational History   Not on file  Tobacco Use   Smoking status: Never   Smokeless tobacco: Never  Vaping Use   Vaping status: Never Used  Substance and Sexual Activity   Alcohol use: Yes    Alcohol/week: 2.0 - 4.0 standard drinks of alcohol    Types: 2 - 4 Glasses of wine per week    Comment: Occ.   Drug use: No   Sexual activity: Not on file  Other Topics Concern   Not on file  Social History Narrative   Retired Runner, broadcasting/film/video and ten worked at the [employer]   3 children, all are local ([place])   Enjoys  reading, Clinical cytogeneticist, quilting/crocheting   Right handed1   Social Drivers of Corporate investment banker Strain: Not on file  Food Insecurity: Not on file  Transportation Needs: Not on file  Physical Activity: Not on file  Stress: Not on file  Social Connections: Not on file  Intimate Partner Violence: Not on file    Outpatient Medications Prior to Visit  Medication Sig Dispense Refill   alendronate  (FOSAMAX ) 70 MG tablet Take 1 tablet (70 mg total) by mouth every 7 (seven) days. Take with a full glass of water on an empty stomach. 12 tablet 4   ALPRAZolam  (XANAX ) 0.5 MG tablet Take 1 tablet (0.5 mg total) by mouth 3 (three) times daily as needed. 30 tablet 0   aspirin  EC 325 MG tablet Take 1 tablet (325 mg total) by mouth daily.     betamethasone  dipropionate (DIPROLENE ) 0.05 % cream Apply topically 2 (two) times daily. 30 g 0   Calcium Carbonate-Vit D-Min (CALTRATE 600+D PLUS MINERALS) 600-800 MG-UNIT TABS Take 1 tablet by mouth in the morning and at bedtime.     diazepam  (VALIUM ) 5 MG tablet Take 1 tablet 30-40 minutes prior to MRI.  May repeat 1 tablet at MRI facility if needed. 2 tablet 0   escitalopram  (LEXAPRO ) 20 MG tablet TAKE 1 TABLET BY MOUTH EVERY DAY 90 tablet 1   Evolocumab  (REPATHA  SURECLICK) 140 MG/ML SOAJ Inject 140 mg into the skin every 14 (fourteen) days. 2 mL 2   fluticasone  (FLONASE ) 50 MCG/ACT nasal spray SPRAY 2 SPRAYS INTO EACH NOSTRIL EVERY DAY 48 mL 1   insulin degludec  (TRESIBA ) 100 UNIT/ML FlexTouch Pen Inject 0-50 Units into the skin as directed.     JARDIANCE 25 MG TABS tablet Take 25 mg by mouth daily.     losartan  (COZAAR ) 100 MG tablet TAKE 1 TABLET BY MOUTH EVERY DAY 90 tablet 1   Semaglutide , 2 MG/DOSE, (OZEMPIC , 2 MG/DOSE,) 8 MG/3ML SOPN Inject 2 mg into the skin once a week. 3 mL 2   ezetimibe  (ZETIA ) 10 MG tablet Take 1 tablet (10 mg total) by mouth daily. 90 tablet 1   pravastatin (PRAVACHOL) 40 MG tablet Take 40 mg by mouth daily.     No  facility-administered medications prior to visit.    Allergies  Allergen Reactions   Ezetimibe -Simvastatin Other (See Comments)    Extreme muscle pain and weakness   Lovastatin Other (See Comments)    Extreme muscle pain and weakness   Rosuvastatin Other (See Comments)    Extreme muscle pain and weakness    ROS See HPI    Objective:    Physical Exam Constitutional:      General: She is not in acute distress.    Appearance:  Normal appearance. She is well-developed.  HENT:     Head: Normocephalic and atraumatic.     Right Ear: External ear normal.     Left Ear: External ear normal.  Eyes:     General: No scleral icterus. Neck:     Thyroid : No thyromegaly.  Cardiovascular:     Rate and Rhythm: Normal rate and regular rhythm.     Heart sounds: Normal heart sounds. No murmur heard. Pulmonary:     Effort: Pulmonary effort is normal. No respiratory distress.     Breath sounds: Normal breath sounds. No wheezing.  Musculoskeletal:     Cervical back: Neck supple.  Skin:    General: Skin is warm and dry.  Neurological:     Mental Status: She is alert and oriented to person, place, and time.  Psychiatric:        Mood and Affect: Mood normal.        Behavior: Behavior normal.        Thought Content: Thought content normal.        Judgment: Judgment normal.      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 Following with Dr. Tobie, Endocrinology. Last A1C was acceptable. Continue same, update  A1c.  Lab Results  Component Value Date   HGBA1C 7.1 (H) 05/15/2023   HGBA1C 6.6 10/29/2022   HGBA1C 8.6 10/10/2021   Lab Results  Component Value Date   LDLCALC 96 05/15/2023   CREATININE 0.94 05/15/2023   Advised pt to update DM eye exam.       Relevant Orders   Urine Microalbumin w/creat. ratio   HgB A1c   Basic metabolic panel with GFR   Osteoporosis without current pathological fracture   Continues weekly fosamax . Dexa up to date.      Hyperlipidemia - Primary   Update lipid panel now that she is on repatha .       Relevant Orders   Lipid panel   History of stroke   right occipital region and left cerebellum per MRI. Continue secondary stroke prevention with aspirin  325mg , Repatha , bp control and glycemic control.       Depression with anxiety   Reports mood is stable on lexapro .        I have discontinued Wendy SQUIBB. Herbel's ezetimibe  and pravastatin. I am also having her maintain her betamethasone  dipropionate, Jardiance, insulin degludec , ALPRAZolam , fluticasone , Caltrate 600+D Plus Minerals, escitalopram , alendronate , aspirin  EC, Repatha  SureClick, Ozempic  (2 MG/DOSE), losartan , and diazepam .  No orders of the defined types were placed in this encounter.

## 2023-08-27 NOTE — Patient Instructions (Signed)
 VISIT SUMMARY:  You had a follow-up visit to review your recent diagnostic tests and manage your medications. Your previous stroke was confirmed, and your heart and arteries are in good condition. Your cholesterol, blood pressure, and diabetes are being managed with your current medications, and you are tolerating them well. No new neurological symptoms were noted.  YOUR PLAN:  CEREBROVASCULAR ACCIDENT (CVA): You had a previous stroke confirmed by imaging, with no new neurological symptoms. -Maintain control of your blood pressure, diabetes, and cholesterol.  CHRONIC SMALL VESSEL DISEASE: This condition is linked to your high cholesterol, diabetes, and high blood pressure. It can be prevented by controlling these risk factors. -Continue to manage your cholesterol, blood pressure, and blood glucose levels.  HYPERLIPIDEMIA: You have high cholesterol and are intolerant to some medications. You are currently on Repatha  to manage your cholesterol. -Check your cholesterol levels today. -Continue taking Repatha  as prescribed.  HYPERTENSION: Your blood pressure is well-controlled, but it was slightly elevated today. -Recheck your blood pressure with a large cuff. -Continue your current blood pressure medications.  TYPE 2 DIABETES MELLITUS: Your last A1c was 7.1%. You are taking Ozempic  weekly, which is helping to control your blood sugar and appetite. -Update your A1c today. -Continue taking Ozempic  2 mg weekly. -Follow up in three months to monitor your blood glucose levels.  OSTEOPOROSIS: You are taking Fosamax  weekly and following the necessary precautions. Your last bone density test was in October 2024. -Continue taking Fosamax  as prescribed. -Plan for a bone density check in October 2026.  GENERAL HEALTH MAINTENANCE: You are due for an eye exam. -Reschedule your eye exam with Dr. Manuelita. -Request Dr. Manuelita to send a copy of the eye exam results for our records.  FOLLOW-UP: You need  to schedule a follow-up appointment. -Schedule a follow-up appointment after October 8 to update your A1c.

## 2023-08-27 NOTE — Assessment & Plan Note (Signed)
 Update lipid panel now that she is on repatha .

## 2023-08-27 NOTE — Assessment & Plan Note (Signed)
 right occipital region and left cerebellum per MRI. Continue secondary stroke prevention with aspirin  325mg , Repatha , bp control and glycemic control.

## 2023-08-27 NOTE — Assessment & Plan Note (Addendum)
 Tolerating ozempic , jardiance and tresiba . Following with Dr. Tobie, Endocrinology. Last A1C was acceptable. Continue same, update A1c.  Lab Results  Component Value Date   HGBA1C 7.1 (H) 05/15/2023   HGBA1C 6.6 10/29/2022   HGBA1C 8.6 10/10/2021   Lab Results  Component Value Date   LDLCALC 96 05/15/2023   CREATININE 0.94 05/15/2023   Advised pt to update DM eye exam.

## 2023-08-27 NOTE — Assessment & Plan Note (Signed)
 Continues weekly fosamax . Dexa up to date.

## 2023-08-27 NOTE — Progress Notes (Signed)
 LMOVM to call the office [place].

## 2023-08-27 NOTE — Assessment & Plan Note (Signed)
 Reports mood is stable on lexapro .

## 2023-08-28 ENCOUNTER — Ambulatory Visit: Payer: Self-pay | Admitting: Family

## 2023-08-28 NOTE — Progress Notes (Signed)
 LMOVM to call the office.

## 2023-08-28 NOTE — Progress Notes (Signed)
 LMOVM

## 2023-08-29 ENCOUNTER — Ambulatory Visit: Payer: Self-pay

## 2023-08-29 ENCOUNTER — Ambulatory Visit (HOSPITAL_BASED_OUTPATIENT_CLINIC_OR_DEPARTMENT_OTHER)
Admission: RE | Admit: 2023-08-29 | Discharge: 2023-08-29 | Disposition: A | Discharge status: Home / Self Care | Source: Ambulatory Visit

## 2023-08-29 DIAGNOSIS — Z136 Encounter for screening for cardiovascular disorders: Secondary | ICD-10-CM | POA: Diagnosis not present

## 2023-08-29 DIAGNOSIS — E785 Hyperlipidemia, unspecified: Secondary | ICD-10-CM | POA: Diagnosis not present

## 2023-08-29 DIAGNOSIS — Z8249 Family history of ischemic heart disease and other diseases of the circulatory system: Secondary | ICD-10-CM | POA: Diagnosis not present

## 2023-08-29 DIAGNOSIS — E119 Type 2 diabetes mellitus without complications: Secondary | ICD-10-CM | POA: Diagnosis not present

## 2023-08-29 DIAGNOSIS — I10 Essential (primary) hypertension: Secondary | ICD-10-CM | POA: Diagnosis not present

## 2023-09-11 ENCOUNTER — Other Ambulatory Visit: Payer: Self-pay | Admitting: Family

## 2023-10-05 ENCOUNTER — Other Ambulatory Visit: Payer: Self-pay | Admitting: Family

## 2023-10-05 DIAGNOSIS — E113291 Type 2 diabetes mellitus with mild nonproliferative diabetic retinopathy without macular edema, right eye: Secondary | ICD-10-CM

## 2023-10-05 DIAGNOSIS — E785 Hyperlipidemia, unspecified: Secondary | ICD-10-CM

## 2023-10-06 ENCOUNTER — Other Ambulatory Visit: Payer: Self-pay | Admitting: Family

## 2023-10-06 DIAGNOSIS — E113291 Type 2 diabetes mellitus with mild nonproliferative diabetic retinopathy without macular edema, right eye: Secondary | ICD-10-CM

## 2023-10-30 DIAGNOSIS — R809 Proteinuria, unspecified: Secondary | ICD-10-CM | POA: Diagnosis not present

## 2023-10-30 DIAGNOSIS — E785 Hyperlipidemia, unspecified: Secondary | ICD-10-CM | POA: Diagnosis not present

## 2023-10-30 DIAGNOSIS — E1129 Type 2 diabetes mellitus with other diabetic kidney complication: Secondary | ICD-10-CM | POA: Diagnosis not present

## 2023-10-30 DIAGNOSIS — E113291 Type 2 diabetes mellitus with mild nonproliferative diabetic retinopathy without macular edema, right eye: Secondary | ICD-10-CM | POA: Diagnosis not present

## 2023-11-04 ENCOUNTER — Ambulatory Visit: Payer: Self-pay

## 2023-11-04 DIAGNOSIS — R9431 Abnormal electrocardiogram [ECG] [EKG]: Secondary | ICD-10-CM | POA: Diagnosis not present

## 2023-11-15 ENCOUNTER — Ambulatory Visit: Admitting: Family

## 2023-12-03 ENCOUNTER — Ambulatory Visit: Admitting: Family

## 2023-12-03 ENCOUNTER — Telehealth: Payer: Self-pay | Admitting: Family

## 2023-12-03 ENCOUNTER — Encounter: Payer: Self-pay | Admitting: Family

## 2023-12-03 VITALS — BP 130/77 | HR 92 | Temp 98.1°F | Resp 16 | Ht 66.0 in | Wt 190.8 lb

## 2023-12-03 DIAGNOSIS — Z23 Encounter for immunization: Secondary | ICD-10-CM

## 2023-12-03 DIAGNOSIS — J309 Allergic rhinitis, unspecified: Secondary | ICD-10-CM

## 2023-12-03 DIAGNOSIS — Z8673 Personal history of transient ischemic attack (TIA), and cerebral infarction without residual deficits: Secondary | ICD-10-CM | POA: Diagnosis not present

## 2023-12-03 DIAGNOSIS — E785 Hyperlipidemia, unspecified: Secondary | ICD-10-CM

## 2023-12-03 DIAGNOSIS — Z7985 Long-term (current) use of injectable non-insulin antidiabetic drugs: Secondary | ICD-10-CM | POA: Diagnosis not present

## 2023-12-03 DIAGNOSIS — M810 Age-related osteoporosis without current pathological fracture: Secondary | ICD-10-CM | POA: Diagnosis not present

## 2023-12-03 DIAGNOSIS — E113291 Type 2 diabetes mellitus with mild nonproliferative diabetic retinopathy without macular edema, right eye: Secondary | ICD-10-CM | POA: Diagnosis not present

## 2023-12-03 DIAGNOSIS — I10 Essential (primary) hypertension: Secondary | ICD-10-CM | POA: Diagnosis not present

## 2023-12-03 DIAGNOSIS — F418 Other specified anxiety disorders: Secondary | ICD-10-CM | POA: Diagnosis not present

## 2023-12-03 LAB — BASIC METABOLIC PANEL WITH GFR
BUN: 13 mg/dL (ref 6–23)
CO2: 24 meq/L (ref 19–32)
Calcium: 9.5 mg/dL (ref 8.4–10.5)
Chloride: 106 meq/L (ref 96–112)
Creatinine, Ser: 0.92 mg/dL (ref 0.40–1.20)
GFR: 60.29 mL/min (ref >60.00)
Glucose, Bld: 98 mg/dL (ref 70–99)
Potassium: 3.8 meq/L (ref 3.5–5.1)
Sodium: 140 meq/L (ref 135–145)

## 2023-12-03 LAB — HEMOGLOBIN A1C: Hgb A1c MFr Bld: 6.4 % (ref 4.6–6.5)

## 2023-12-03 MED ORDER — ESCITALOPRAM OXALATE 20 MG PO TABS: 20.0000 mg | ORAL_TABLET | Freq: Every day | ORAL | 1 refills | Status: AC

## 2023-12-03 MED ORDER — OZEMPIC (2 MG/DOSE) 8 MG/3ML ~~LOC~~ SOPN: 2.0000 mg | PEN_INJECTOR | SUBCUTANEOUS | 2 refills | Status: AC

## 2023-12-03 MED ORDER — REPATHA SURECLICK 140 MG/ML ~~LOC~~ SOAJ: 140.0000 mg | SUBCUTANEOUS | 1 refills | Status: AC

## 2023-12-03 MED ORDER — FLUTICASONE PROPIONATE 50 MCG/ACT NA SUSP: 2.0000 | Freq: Every day | NASAL | 1 refills | Status: AC

## 2023-12-03 NOTE — Addendum Note (Signed)
 Addended by: TRUDY CURVIN RAMAN on: 12/03/2023 09:53 AM   Modules accepted: Orders

## 2023-12-03 NOTE — Telephone Encounter (Signed)
 Please call Dr. Norleen Shaver to request DM eye exam from 9/9.

## 2023-12-03 NOTE — Telephone Encounter (Signed)
 Electronic request made

## 2023-12-03 NOTE — Patient Instructions (Signed)
 VISIT SUMMARY:  You had a follow-up visit to review your medications and overall health. Your diabetes, hypertension, and cholesterol are well-controlled with your current medications. Your mood is stable, and your osteoporosis is being managed effectively. We discussed your occasional headaches and morning allergies.  YOUR PLAN:  TYPE 2 DIABETES MELLITUS: Your diabetes is well-controlled with your current medications. Your last A1c was 6.6, which is an improvement. -We will update your A1c today. -Continue taking Jardiance, Tresiba , and Ozempic  as prescribed. -Follow up with Dr. Tobie.  HYPERTENSION: Your blood pressure is well-controlled. -Continue taking losartan  100 mg daily. -Continue taking aspirin  325 mg daily.  HYPERLIPIDEMIA: Your cholesterol levels are well-controlled with Repatha . -Continue taking Repatha .  OSTEOPOROSIS: Your bone density scan showed stable bone thinning. -Continue taking Fosamax  weekly. -Repeat your bone density scan in one year.  MAJOR DEPRESSIVE DISORDER: Your mood is stable with Lexapro . -Continue taking Lexapro .  ALLERGIC RHINITIS: You have morning allergy symptoms. -Continue using Flonase . -Consider adding Zyrtec or Claritin if needed.  HEADACHE: You have occasional stress-related headaches that are manageable. -Continue with your current management.

## 2023-12-03 NOTE — Assessment & Plan Note (Signed)
 Notes some increased symptoms despite flonase , recommend that she add claritin.

## 2023-12-03 NOTE — Addendum Note (Signed)
 Addended by: WELLS LEVORN HERO on: 12/03/2023 10:32 AM   Modules accepted: Orders

## 2023-12-03 NOTE — Assessment & Plan Note (Signed)
 Continues fosamax  once weekly. Last bone density 10/24 stable.

## 2023-12-03 NOTE — Assessment & Plan Note (Signed)
 Lab Results  Component Value Date   CHOL 101 08/27/2023   HDL 65 08/27/2023   LDLCALC 20 08/27/2023   TRIG 77 08/27/2023   CHOLHDL 1.6 08/27/2023   Lipids at goal on repatha  which she is tolerating well.

## 2023-12-03 NOTE — Progress Notes (Signed)
 Subjective:     Patient ID: Wendy Guzman, female    DOB: [birth year], 78 y.o.   MRN: [id number]  Chief Complaint  Patient presents with   Diabetes    Here for follow up   Hypertension    Here for follow up    Diabetes  Hypertension    Discussed the use of AI scribe software for clinical note transcription with the patient, who gave verbal consent to proceed.  History of Present Illness  Wendy Guzman is a 78 year old female who presents for a follow-up on her medications.  She manages diabetes with Jardiance, Tresiba  (20-25 units, aiming for 20), and Ozempic  2 mg weekly. Morning blood sugars are typically around 100. She continues to follow with endocrinology- Dr. Tobie. Her last A1c in July was 6.6. For osteoporosis, she takes Fosamax  weekly and follows the recommended post-dose precautions. Her last bone density scan in October indicated some bone thinning.  Repatha  is used for cholesterol management without issues. She takes losartan  100 mg for blood pressure and aspirin  325 mg for stroke prevention. Her mood is stable on Lexapro . Morning allergies are treated with Flonase , and she has not used antihistamines recently. Stress headaches occur occasionally but are manageable.     Health Maintenance Due  Topic Date Due   DTaP/Tdap/Td (1 - Tdap) Never done   Medicare Annual Wellness (AWV)  08/07/2016   OPHTHALMOLOGY EXAM  08/07/2023   Influenza Vaccine  09/20/2023   COVID-19 Vaccine (4 - 2025-26 season) 10/21/2023    Past Medical History:  Diagnosis Date   Abnormal EKG 07/12/2023   Allergic rhinitis 05/15/2023   Colon polyps    Depression    Depression with anxiety 02/06/2016   Diabetes mellitus without complication (HCC)    Frequent headaches    History of chicken pox    HTN (hypertension) 02/06/2016   Hyperlipidemia    Osteoporosis without current pathological fracture 11/27/2022   TIA (transient ischemic attack) 07/12/2023   Type 2  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 edema, without long-term current use of insulin (HCC) 02/06/2016    Past Surgical History:  Procedure Laterality Date   TONSILLECTOMY  1950s   TUBAL LIGATION  1976    Family History  Problem Relation Age of Onset   COPD Mother    Aneurysm Mother        AAA   Cancer Father        lung   Diabetes Father    Atrial fibrillation Brother    Diabetes Mellitus II Brother    Atrial fibrillation Brother    Diabetes Mellitus II Brother    Aneurysm Maternal Uncle    Aneurysm Maternal Uncle    Diabetes Paternal Uncle    Hypertension Maternal Grandmother     Social History   Socioeconomic History   Marital status: Married    Spouse name: Not on file   Number of children: Not on file   Years of education: Not on file   Highest education level: Not on file  Occupational History   Not on file  Tobacco Use   Smoking status: Never   Smokeless tobacco: Never  Vaping Use   Vaping status: Never Used  Substance and Sexual Activity   Alcohol use: Yes    Alcohol/week: 2.0 - 4.0 standard drinks of alcohol    Types: 2 - 4 Glasses of wine per week    Comment: Occ.   Drug use:  No   Sexual activity: Not on file  Other Topics Concern   Not on file  Social History Narrative   Retired Runner, broadcasting/film/video and ten worked at the [employer]   3 children, all are local ([place])   Enjoys reading, Clinical cytogeneticist, quilting/crocheting   Right handed1   Social Drivers of Corporate investment banker Strain: Not on file  Food Insecurity: Not on file  Transportation Needs: Not on file  Physical Activity: Not on file  Stress: Not on file  Social Connections: Not on file  Intimate Partner Violence: Not on file    Outpatient Medications Prior to Visit  Medication Sig Dispense Refill   alendronate  (FOSAMAX ) 70 MG tablet Take 1 tablet (70 mg total) by mouth every 7 (seven) days. Take with a full glass  of water on an empty stomach. 12 tablet 4   ALPRAZolam  (XANAX ) 0.5 MG tablet Take 1 tablet (0.5 mg total) by mouth 3 (three) times daily as needed. 30 tablet 0   aspirin  EC 325 MG tablet Take 1 tablet (325 mg total) by mouth daily.     betamethasone  dipropionate (DIPROLENE ) 0.05 % cream Apply topically 2 (two) times daily. 30 g 0   Calcium Carbonate-Vit D-Min (CALTRATE 600+D PLUS MINERALS) 600-800 MG-UNIT TABS Take 1 tablet by mouth in the morning and at bedtime.     insulin degludec  (TRESIBA ) 100 UNIT/ML FlexTouch Pen Inject 0-50 Units into the skin as directed.     JARDIANCE 25 MG TABS tablet Take 25 mg by mouth daily.     losartan  (COZAAR ) 100 MG tablet TAKE 1 TABLET BY MOUTH EVERY DAY 90 tablet 1   diazepam  (VALIUM ) 5 MG tablet Take 1 tablet 30-40 minutes prior to MRI.  May repeat 1 tablet at MRI facility if needed. 2 tablet 0   escitalopram  (LEXAPRO ) 20 MG tablet TAKE 1 TABLET BY MOUTH EVERY DAY 90 tablet 1   Evolocumab  (REPATHA  SURECLICK) 140 MG/ML SOAJ INJECT 140 MG INTO THE SKIN EVERY 14 (FOURTEEN) DAYS. 2 mL 2   fluticasone  (FLONASE ) 50 MCG/ACT nasal spray SPRAY 2 SPRAYS INTO EACH NOSTRIL EVERY DAY 48 mL 1   Semaglutide , 2 MG/DOSE, (OZEMPIC , 2 MG/DOSE,) 8 MG/3ML SOPN INJECT 2 MG INTO THE SKIN ONCE A WEEK. 3 mL 2   No facility-administered medications prior to visit.    Allergies  Allergen Reactions   Ezetimibe -Simvastatin Other (See Comments)    Extreme muscle pain and weakness   Lovastatin Other (See Comments)    Extreme muscle pain and weakness   Rosuvastatin Other (See Comments)    Extreme muscle pain and weakness    ROS See HPI    Objective:    Physical Exam Constitutional:      General: She is not in acute distress.    Appearance: Normal appearance. She is well-developed.  HENT:     Head: Normocephalic and atraumatic.     Right Ear: External ear normal.     Left Ear: External ear normal.  Eyes:     General: No scleral icterus. Neck:     Thyroid : No  thyromegaly.  Cardiovascular:     Rate and Rhythm: Normal rate and regular rhythm.     Heart sounds: Normal heart sounds. No murmur heard. Pulmonary:     Effort: Pulmonary effort is normal. No respiratory distress.     Breath sounds: Normal breath sounds. No wheezing.  Musculoskeletal:     Cervical back: Neck supple.  Skin:    General:  Skin is warm and dry.  Neurological:     Mental Status: She is alert and oriented to person, place, and time.  Psychiatric:        Mood and Affect: Mood normal.        Behavior: Behavior normal.        Thought Content: Thought content normal.        Judgment: Judgment normal.      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        Relevant Medications   Evolocumab  (REPATHA  SURECLICK) 140 MG/ML SOAJ   Semaglutide , 2 MG/DOSE, (OZEMPIC , 2 MG/DOSE,) 8 MG/3ML SOPN   Other Relevant Orders   HgB A1c   Basic Metabolic Panel (BMET)   Urine Microalbumin w/creat. ratio   Osteoporosis without current pathological fracture   Continues fosamax  once weekly. Last bone density 10/24 stable.         Hyperlipidemia   Lab Results  Component Value Date   CHOL 101 08/27/2023   HDL 65 08/27/2023   LDLCALC 20 08/27/2023   TRIG 77 08/27/2023   CHOLHDL 1.6 08/27/2023   Lipids at goal on repatha  which she is tolerating well.        Relevant Medications   Evolocumab  (REPATHA  SURECLICK) 140 MG/ML SOAJ   HTN (hypertension)   BP Readings from Last 3 Encounters:  12/03/23 130/77  08/27/23 118/65  07/31/23 132/77   At goal on losartan  100mg .       Relevant Medications   Evolocumab  (REPATHA  SURECLICK) 140 MG/ML SOAJ   History of stroke   On repatha  and aspirin  325mg  once daily for secondary stroke prevention.       Depression with anxiety   Reports stable mood on Lexapro .        Relevant Medications   escitalopram  (LEXAPRO ) 20 MG tablet   Allergic rhinitis   Notes some increased symptoms despite flonase , recommend that she add claritin.       Relevant Medications   fluticasone  (FLONASE ) 50 MCG/ACT nasal spray    I have discontinued Wendy P. Colombe's diazepam . I have also changed her fluticasone  and escitalopram . Additionally, I am having her maintain her betamethasone  dipropionate, Jardiance, insulin degludec , ALPRAZolam , Caltrate 600+D Plus Minerals, alendronate , aspirin  EC, losartan , Repatha  SureClick, and Ozempic  (2 MG/DOSE).  Meds ordered this encounter  Medications   fluticasone  (FLONASE ) 50 MCG/ACT nasal spray    Sig: Place 2 sprays into both nostrils  daily.    Dispense:  48 mL    Refill:  1   Evolocumab  (REPATHA  SURECLICK) 140 MG/ML SOAJ    Sig: Inject 140 mg into the skin every 14 (fourteen) days.    Dispense:  6 mL    Refill:  1    Supervising Provider:   DOMENICA BLACKBIRD A [[id number]]   Semaglutide , 2 MG/DOSE, (OZEMPIC , 2 MG/DOSE,) 8 MG/3ML SOPN    Sig: Inject 2 mg into the skin once a week.    Dispense:  3 mL    Refill:  2    Supervising Provider:   DOMENICA BLACKBIRD A [[id number]]   escitalopram  (LEXAPRO ) 20 MG tablet    Sig: Take 1 tablet (20 mg total) by mouth  daily.    Dispense:  90 tablet    Refill:  1    Supervising Provider:   DOMENICA BLACKBIRD A [[id number]]

## 2023-12-03 NOTE — Assessment & Plan Note (Signed)
-   Reports stable mood on Lexapro

## 2023-12-03 NOTE — Assessment & Plan Note (Signed)
 On repatha  and aspirin  325mg  once daily for secondary stroke prevention.

## 2023-12-03 NOTE — Assessment & Plan Note (Signed)
 Lab Results  Component Value Date   HGBA1C 6.6 (H) 08/27/2023   HGBA1C 7.1 (H) 05/15/2023   HGBA1C 6.6 10/29/2022   Lab Results  Component Value Date   MICROALBUR 3.6 (H) 08/27/2023   LDLCALC 20 08/27/2023   CREATININE 1.11 08/27/2023   Stable on ozempic , jardiance and tresiba  20 units.

## 2023-12-03 NOTE — Assessment & Plan Note (Signed)
 BP Readings from Last 3 Encounters:  12/03/23 130/77  08/27/23 118/65  07/31/23 132/77   At goal on losartan  100mg .

## 2023-12-24 NOTE — Progress Notes (Signed)
 Scheduled

## 2024-01-06 ENCOUNTER — Telehealth: Payer: Self-pay | Admitting: Family

## 2024-01-06 NOTE — Telephone Encounter (Signed)
 Copied from CRM #[id number]. Topic: Medicare AWV >> Jan 06, 2024  1:15 PM Nathanel DEL wrote: Called LVM 01/06/2024 to sched AWV. Please schedule in office or virtual visit.   Nathanel Paschal; Care Guide Ambulatory Clinical Support [facility] Direct Dial: [phone]

## 2024-01-22 ENCOUNTER — Ambulatory Visit

## 2024-03-05 NOTE — Progress Notes (Signed)
 "  Virtual Visit via Video Note:   Consent was obtained for video visit:  Yes.   Answered questions that patient had about telehealth interaction:  Yes.   I discussed the limitations, risks, security and privacy concerns of performing an evaluation and management service by telemedicine. I also discussed with the patient that there may be a patient responsible charge related to this service. The patient expressed understanding and agreed to proceed.  Pt location: Home Physician Location: office Name of referring provider:  Daryl Setter, NP I connected with Wendy Guzman at patients initiation/request on 03/09/2024 at  9:30 AM EST by video enabled telemedicine application and verified that I am speaking with the correct person using two identifiers. Pt MRN:  [id number] Pt DOB:  Apr 09, 1945 Video Participants:  Wendy Guzman  Assessment/Plan:   Concern for left hemispheric TIA Hypertension Hyperlipidemia Type 2 diabetes mellitus.   Secondary stroke prevention: Change from ASA 325mg  back to 81mg  daily.  Plan was to change to Plavix but 81mg  ASA isn't less effective in secondary stroke prevention after a TIA vs ASA 325mg  or Plavix, but there is less bleeding risk LDL goal less than 70 Normotensive blood pressure Hgb A1c goal less than 7 Mediterranean diet Routine exercise Follow up 6 months.  Total time spent in chart and face to face with patient:  39 minutes   Subjective:  Wendy Guzman is a 79 year old  right-handed female with HTN, DM 2, HLD and prior history of TIAs who follows up for transient ischemic attack.   MRI/MRA personally reviewed.  UPDATE: Current medications:  ASA 325mg  daily, Repatha , Zetia , losartan , Jardiance  MRI of brain without contrast on 08/22/2023 showed mild chronic small vessel ischemic changes and small remote infarct in the right occipital lobe and left cerebellum but no acute/subacute intracranial abnormalities or  findings to explain symptoms.  MRA of head was normal.    LDL from 08/27/2023 was 20.  Hgb A1c from 12/03/2023 was 6.4.  HISTORY: On 07/06/2023, she developed speech difficulty.  She was talking to her daughter on the phone when she suddenly had word-finding difficulty.  No slurred speech, facial droop, headache, visual changes or unilateral numbness or weakness.  Symptoms lasted about 1 to 2 hours.  At the time she was on ASA 81mg .  She has statin intolerance and takes Zetia .  She reports that she had been very stressed out that week.     Previous labs from 05/15/2023 revealed Hgb A1c 7.1 and LDL 96.     She followed up with her PCP who started Repatha , increased ASA to 325mg  and increased Ozempic .  Stroke work up has been initiated.  Carotid ultrasound on 07/16/2023 revealed just minimal atherosclerosis with less than 50% stenosis bilaterally.  ECG on 5/23 and 07/25/2023 both revealed NSR.  She has just started ambulatory cardiac event monitor and echocardiogram is scheduled for 08/22/2023.    She has never had prior similar episodes or recurrent episodes since then.  She had prior brain imaging in the 90s for headaches and was told that she had silent strokes on the scan.    Past Medical History: Past Medical History:  Diagnosis Date   Abnormal EKG 07/12/2023   Allergic rhinitis 05/15/2023   Colon polyps    Depression    Depression with anxiety 02/06/2016   Diabetes mellitus without complication (HCC)    Frequent headaches    History of chicken pox    History of stroke 07/12/2023  HTN (hypertension) 02/06/2016   Hyperlipidemia    Osteoporosis without current pathological fracture 11/27/2022   TIA (transient ischemic attack) 07/12/2023   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, without long-term current use of insulin (HCC) 02/06/2016    Medications: Outpatient Encounter Medications as of 03/09/2024  Medication Sig Note    alendronate  (FOSAMAX ) 70 MG tablet Take 1 tablet (70 mg total) by mouth every 7 (seven) days. Take with a full glass of water on an empty stomach.    ALPRAZolam  (XANAX ) 0.5 MG tablet Take 1 tablet (0.5 mg total) by mouth 3 (three) times daily as needed.    aspirin  EC 325 MG tablet Take 1 tablet (325 mg total) by mouth daily.    betamethasone  dipropionate (DIPROLENE ) 0.05 % cream Apply topically 2 (two) times daily. 07/31/2023: As needed   Calcium Carbonate-Vit D-Min (CALTRATE 600+D PLUS MINERALS) 600-800 MG-UNIT TABS Take 1 tablet by mouth in the morning and at bedtime.    escitalopram  (LEXAPRO ) 20 MG tablet Take 1 tablet (20 mg total) by mouth daily.    Evolocumab  (REPATHA  SURECLICK) 140 MG/ML SOAJ Inject 140 mg into the skin every 14 (fourteen) days.    fluticasone  (FLONASE ) 50 MCG/ACT nasal spray Place 2 sprays into both nostrils daily.    insulin degludec  (TRESIBA ) 100 UNIT/ML FlexTouch Pen Inject 0-50 Units into the skin as directed.    JARDIANCE 25 MG TABS tablet Take 25 mg by mouth daily.    losartan  (COZAAR ) 100 MG tablet TAKE 1 TABLET BY MOUTH EVERY DAY    Semaglutide , 2 MG/DOSE, (OZEMPIC , 2 MG/DOSE,) 8 MG/3ML SOPN Inject 2 mg into the skin once a week.    No facility-administered encounter medications on file as of 03/09/2024.    Allergies: Allergies[1]  Family History: Family History  Problem Relation Age of Onset   COPD Mother    Aneurysm Mother        AAA   Cancer Father        lung   Diabetes Father    Atrial fibrillation Brother    Diabetes Mellitus II Brother    Atrial fibrillation Brother    Diabetes Mellitus II Brother    Aneurysm Maternal Uncle    Aneurysm Maternal Uncle    Diabetes Paternal Uncle    Hypertension Maternal Grandmother     Observations/Objective:   No acute distress.  Alert and oriented.  Speech fluent and not dysarthric.  Language intact.  Eyes orthophoric on primary gaze.  Face symmetric.   Follow up Instructions:      -I discussed  the assessment and treatment plan with the patient. The patient was provided an opportunity to ask questions and all were answered. The patient agreed with the plan and demonstrated an understanding of the instructions.   The patient was advised to call back or seek an in-person evaluation if the symptoms worsen or if the condition fails to improve as anticipated.   Juliene Lamar Dunnings, DO  CC: Eleanor Ponto, NP          [1]  Allergies Allergen Reactions   Ezetimibe -Simvastatin Other (See Comments)    Extreme muscle pain and weakness   Lovastatin Other (See Comments)    Extreme muscle pain and weakness   Rosuvastatin Other (See Comments)    Extreme muscle pain and weakness   "

## 2024-03-09 ENCOUNTER — Telehealth: Admitting: Neurology

## 2024-03-09 ENCOUNTER — Encounter: Payer: Self-pay | Admitting: Neurology

## 2024-03-09 ENCOUNTER — Ambulatory Visit

## 2024-03-09 VITALS — BP 136/82 | HR 88 | Ht 66.0 in | Wt 196.0 lb

## 2024-03-09 DIAGNOSIS — Z8249 Family history of ischemic heart disease and other diseases of the circulatory system: Secondary | ICD-10-CM | POA: Diagnosis not present

## 2024-03-09 DIAGNOSIS — R9431 Abnormal electrocardiogram [ECG] [EKG]: Secondary | ICD-10-CM

## 2024-03-09 DIAGNOSIS — I480 Paroxysmal atrial fibrillation: Secondary | ICD-10-CM | POA: Diagnosis not present

## 2024-03-09 DIAGNOSIS — I10 Essential (primary) hypertension: Secondary | ICD-10-CM

## 2024-03-09 DIAGNOSIS — E113291 Type 2 diabetes mellitus with mild nonproliferative diabetic retinopathy without macular edema, right eye: Secondary | ICD-10-CM | POA: Diagnosis not present

## 2024-03-09 DIAGNOSIS — E785 Hyperlipidemia, unspecified: Secondary | ICD-10-CM | POA: Diagnosis not present

## 2024-03-09 DIAGNOSIS — E782 Mixed hyperlipidemia: Secondary | ICD-10-CM

## 2024-03-09 DIAGNOSIS — G459 Transient cerebral ischemic attack, unspecified: Secondary | ICD-10-CM | POA: Diagnosis not present

## 2024-03-09 MED ORDER — RIVAROXABAN 20 MG PO TABS: 20.0000 mg | ORAL_TABLET | Freq: Every day | ORAL | 3 refills | Status: AC

## 2024-03-09 NOTE — Assessment & Plan Note (Signed)
 Last lipid panel for review is from 08/27/2023 LDL 20, HDL 65, total cholesterol 898, triglycerides 77. Well-controlled on current treatment with pravastatin 40 mg once daily, Zetia  10 mg once daily and Repatha .

## 2024-03-09 NOTE — Patient Instructions (Signed)
 Medication Instructions:  Your physician has recommended you make the following change in your medication:   START: Xarelto  20 mg daily  *If you need a refill on your cardiac medications before your next appointment, please call your pharmacy*  Lab Work: Your physician recommends that you return for lab work in:   Labs in 1 month : CBC  If you have labs (blood work) drawn today and your tests are completely normal, you will receive your results only by: [employer] (if you have MyChart) OR A paper copy in the mail If you have any lab test that is abnormal or we need to change your treatment, we will call you to review the results.  Testing/Procedures: None  Follow-Up: At [facility], you and your health needs are our priority.  As part of our continuing mission to provide you with exceptional heart care, our providers are all part of one team.  This team includes your primary Cardiologist (physician) and Advanced Practice Providers or APPs (Physician Assistants and Nurse Practitioners) who all work together to provide you with the care you need, when you need it.  Your next appointment:   6 week(s)  Provider:   Alean Kobus, MD    We recommend signing up for the patient portal called MyChart.  Sign up information is provided on this After Visit Summary.  MyChart is used to connect with patients for Virtual Visits (Telemedicine).  Patients are able to view lab/test results, encounter notes, upcoming appointments, etc.  Non-urgent messages can be sent to your provider as well.   To learn more about what you can do with MyChart, go to [email or web address].   Other Instructions None

## 2024-03-09 NOTE — Assessment & Plan Note (Signed)
 No significant change. Echocardiogram 08/15/2023 with normal biventricular function EF 55 to 60% no wall motion abnormality.

## 2024-03-09 NOTE — Progress Notes (Signed)
 "  Cardiology Consultation:    Date:  03/09/2024   ID:  Wendy Guzman, DOB [birth year], MRN [id number]  PCP:  Wendy Setter, NP  Cardiologist:  Wendy JONELLE Kobus, MD   Referring MD: Wendy Setter, NP   No chief complaint on file.    ASSESSMENT AND PLAN:   Wendy Guzman 79 year old woman with  history of  diabetes mellitus,, hyperlipidemia, intolerant to higher intensity statins, depression, CKD stage II/III, episode of transient word finding difficulty lasting for couple hours on May 17 and did not go to the ER and subsequent imaging with CT and MRI findings consistent with prior stroke.  Echocardiogram 08/15/2023 with normal biventricular function LVEF 55 to 60%, trace MR. Ultrasound carotids May 2025 was unremarkable. CT brain 08/09/2023 noted old left cerebellar infarct and findings of chronic small vessel disease.  No acute intracranial abnormality. MRI brain 08/22/2023 noted small chronic infarct right occipital lobe and left cerebellum without acute intracranial pathology. Normal intracranial MRA of the head 08/22/2023. Ultrasound AAA screen was unremarkable. 28-day heart monitor June 2025 showed paroxysmal atrial fibrillation burden noted 2%, with the longest episode lasting 5 hours and 3 minutes.  Occasional supraventricular ectopy 2.5%.  28 short runs of SVT with the longest episode lasting 18 beats.     Problem List Items Addressed This Visit       Cardiovascular and Mediastinum   HTN (hypertension)   Well-controlled. Continue losartan  100 mg once daily.      Relevant Medications   pravastatin (PRAVACHOL) 40 MG tablet   ezetimibe  (ZETIA ) 10 MG tablet   rivaroxaban  (XARELTO ) 20 MG TABS tablet   Other Relevant Orders   EKG 12-Lead (Completed)   CBC   Paroxysmal atrial fibrillation (HCC) - Primary   Paroxysmal A-fib 2% burden on 28-day heart monitor June 2025. Longest episode 5 hours 3 minutes. Asymptomatic.  Remains in sinus rhythm  today. CHA2DS2-VASc score 7.  Discussed about diagnosis of atrial fibrillation.  Risk for recurrence. Potential symptoms discussed.  Risk for stroke discussed extensively. Recommended anticoagulation for stroke prophylaxis. In this context various options Eliquis, Xarelto , warfarin reviewed.  Including side effects, potential complications such as bleeding availability of reversal agents. She prefers to proceed with Xarelto  given no strict dietary restrictions, routine checking and once a day regimen.  Start Xarelto  20 mg once daily. From cardiac standpoint no indication for aspirin  long run while she remains on Xarelto . However would recommend review with neurologist with regards to long-term need for aspirin  given her history of prior stroke like findings on imaging study.  F/u CBC in 1 month after starting xarelto .         Relevant Medications   pravastatin (PRAVACHOL) 40 MG tablet   ezetimibe  (ZETIA ) 10 MG tablet   rivaroxaban  (XARELTO ) 20 MG TABS tablet     Other   Hyperlipidemia   Last lipid panel for review is from 08/27/2023 LDL 20, HDL 65, total cholesterol 898, triglycerides 77. Well-controlled on current treatment with pravastatin 40 mg once daily, Zetia  10 mg once daily and Repatha .       Relevant Medications   pravastatin (PRAVACHOL) 40 MG tablet   ezetimibe  (ZETIA ) 10 MG tablet   rivaroxaban  (XARELTO ) 20 MG TABS tablet   Abnormal EKG   No significant change. Echocardiogram 08/15/2023 with normal biventricular function EF 55 to 60% no wall motion abnormality.      Relevant Orders   EKG 12-Lead (Completed)   CBC   Family history of abdominal aortic aneurysm  Normal abdominal aortic aneurysm screening ultrasound imaging.         Return to clinic in 6-8 weeks to f/u once she starts Xarelto .    History of Present Illness:    Wendy Guzman is a 79 y.o. female who is being seen today for follow-up visit. PCP is Wendy Setter, NP. Last  visit with me in the office was 07/25/2023.  Lives with her husband at home.  Here for the visit today accompanied by her daughter.  Has history of  diabetes mellitus, hypertension, hyperlipidemia, intolerant to higher intensity statins, depression, CKD stage II/III, episode of transient word finding difficulty lasting for couple hours on May 17 and did not go to the ER and subsequent imaging with CT and MRI findings consistent with prior stroke.  Echocardiogram 08/15/2023 with normal biventricular function LVEF 55 to 60%, trace MR. Ultrasound carotids May 2025 was unremarkable. CT brain 08/09/2023 noted old left cerebellar infarct and findings of chronic small vessel disease.  No acute intracranial abnormality. MRI brain 08/22/2023 noted small chronic infarct right occipital lobe and left cerebellum without acute intracranial pathology. Normal intracranial MRA of the head 08/22/2023. Ultrasound AAA screen was unremarkable.  Was referred for cardiology assessment in the setting of EKG showing findings suggestive of possible old inferior infarct.  EKG at office visit with me in June noted sinus rhythm with left axis deviation without any significant findings suggestive of ischemia.  For further evaluation of TIA-like symptoms 28-day heart monitor showed paroxysmal atrial fibrillation burden noted 2%, with the longest episode lasting 5 hours and 3 minutes.  Occasional supraventricular ectopy 2.5%.  28 short runs of SVT with the longest episode lasting 18 beats.  EKG in clinic today shows sinus rhythm heart rate 82/min, PR interval 180 ms, QRS duration 92 ms, QTc 457 ms, no ischemic changes.  Mentions overall she has been doing well. No further recurrence of symptoms. No chest pain, shortness of breath, orthopnea or paroxysmal nocturnal dyspnea. Does not report any symptoms of lightheadedness, dizziness, syncopal episodes. No blood in urine or stools. No significant weight changes.  Mentions good  compliance with medications at home. Did not bring her medication list to the visit today.   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, without long-term current use of insulin (HCC) 02/06/2016    Past Surgical History:  Procedure Laterality Date   TONSILLECTOMY  1950s   TUBAL LIGATION  1976    Current Medications: Active Medications[1]   Allergies:   Ezetimibe -simvastatin, Lovastatin, and Rosuvastatin   Social History   Socioeconomic History   Marital status: Married    Spouse name: Not on file   Number of children: Not on file   Years of education: Not on file   Highest education level: Not on file  Occupational History   Not on file  Tobacco Use   Smoking status: Never   Smokeless tobacco: Never  Vaping Use   Vaping status: Never Used  Substance  and Sexual Activity   Alcohol use: Yes    Alcohol/week: 2.0 - 4.0 standard drinks of alcohol    Types: 2 - 4 Glasses of wine per week    Comment: Occ.   Drug use: No   Sexual activity: Not on file  Other Topics Concern   Not on file  Social History Narrative   Retired runner, broadcasting/film/video and ten worked at the [employer]   Married   3 children, all are local ([place])   Enjoys reading, clinical cytogeneticist, quilting/crocheting   Right handed1   Social Drivers of Health   Tobacco Use: Low Risk (03/09/2024)   Patient History    Smoking Tobacco Use: Never    Smokeless Tobacco Use: Never    Passive Exposure: Not on  file  Financial Resource Strain: Not on file  Food Insecurity: Not on file  Transportation Needs: Not on file  Physical Activity: Not on file  Stress: Not on file  Social Connections: Not on file  Depression (PHQ2-9): High Risk (12/03/2023)   Depression (PHQ2-9)    PHQ-2 Score: 11  Alcohol Screen: Not on file  Housing: Not on file  Utilities: Not on file  Health Literacy: Not on file     Family History: The patient's family history includes Aneurysm in her maternal uncle, maternal uncle, and mother; Atrial fibrillation in her brother and brother; COPD in her mother; Cancer in her father; Diabetes in her father and paternal uncle; Diabetes Mellitus II in her brother and brother; Hypertension in her maternal grandmother. ROS:   Please see the history of present illness.    All 14 point review of systems negative except as described per history of present illness.  EKGs/Labs/Other Studies Reviewed:    The following studies were reviewed today:   EKG:  EKG Interpretation Date/Time:  Monday March 09 2024 12:03:34 EST Ventricular Rate:  82 PR Interval:  180 QRS Duration:  92 QT Interval:  392 QTC Calculation: 457 R Axis:   -54  Text Interpretation: Normal sinus rhythm Left axis deviation Low voltage QRS Inferior infarct , age undetermined Cannot rule out Anterior infarct , age undetermined When compared with ECG of 25-Jul-2023 08:19, Minimal criteria for Anterior infarct are now Present Inferior infarct is now Present Confirmed by Liborio Hai reddy [phone]) on 03/09/2024 12:05:28 PM    Recent Labs: 05/15/2023: ALT 25 12/03/2023: BUN 13; Creatinine, Ser 0.92; Potassium 3.8; Sodium 140  Recent Lipid Panel    Component Value Date/Time   CHOL 101 08/27/2023 0925   TRIG 77 08/27/2023 0925   HDL 65 08/27/2023 0925   CHOLHDL 1.6 08/27/2023 0925   VLDL 14.4 05/15/2023 0848   LDLCALC 20 08/27/2023 0925    Physical Exam:    VS:  BP 136/82   Pulse 88   Ht 5' 6 (1.676 m)    Wt 196 lb (88.9 kg)   SpO2 96%   BMI 31.64 kg/m     Wt Readings from Last 3 Encounters:  03/09/24 196 lb (88.9 kg)  12/03/23 190 lb 12.8 oz (86.5 kg)  08/27/23 194 lb 9.6 oz (88.3 kg)     GENERAL:  Well nourished, well developed in no acute distress NECK: No JVD; No carotid bruits CARDIAC: RRR, S1 and S2 present, no murmurs, no rubs, no gallops CHEST:  Clear to auscultation without rales, wheezing or rhonchi  Extremities: No pitting pedal edema. Pulses bilaterally symmetric with radial 2+ and dorsalis pedis 2+ NEUROLOGIC:  Alert and oriented x 3  Medication Adjustments/Labs  and Tests Ordered: Current medicines are reviewed at length with the patient today.  Concerns regarding medicines are outlined above.  Orders Placed This Encounter  Procedures   CBC   EKG 12-Lead   Meds ordered this encounter  Medications   rivaroxaban  (XARELTO ) 20 MG TABS tablet    Sig: Take 1 tablet (20 mg total) by mouth daily with supper.    Dispense:  90 tablet    Refill:  3    Signed, Kortny Lirette reddy Benoit Meech, MD, MPH, [facility]. 03/09/2024 12:16 PM    [facility]     [1]  Current Meds  Medication Sig   ACCU-CHEK AVIVA PLUS test strip daily.   alendronate  (FOSAMAX ) 70 MG tablet Take 1 tablet (70 mg total) by mouth every 7 (seven) days. Take with a full glass of water on an empty stomach.   ALPRAZolam  (XANAX ) 0.5 MG tablet Take 1 tablet (0.5 mg total) by mouth 3 (three) times daily as needed.   aspirin  EC 325 MG tablet Take 1 tablet (325 mg total) by mouth daily. (Patient taking differently: Take 81 mg by mouth daily.)   betamethasone  dipropionate (DIPROLENE ) 0.05 % cream Apply topically 2 (two) times daily.   Calcium Carbonate-Vit D-Min (CALTRATE 600+D PLUS MINERALS) 600-800 MG-UNIT TABS Take 1 tablet by mouth in the morning and at bedtime.   EMBECTA PEN NEEDLE ULTRAFINE 31G X 5 MM MISC daily.   escitalopram  (LEXAPRO ) 20 MG tablet Take 1 tablet (20 mg total) by mouth  daily.   Evolocumab  (REPATHA  SURECLICK) 140 MG/ML SOAJ Inject 140 mg into the skin every 14 (fourteen) days.   ezetimibe  (ZETIA ) 10 MG tablet Take 10 mg by mouth daily.   fluticasone  (FLONASE ) 50 MCG/ACT nasal spray Place 2 sprays into both nostrils daily.   insulin degludec  (TRESIBA ) 100 UNIT/ML FlexTouch Pen Inject 0-50 Units into the skin as directed.   JARDIANCE 25 MG TABS tablet Take 25 mg by mouth daily.   losartan  (COZAAR ) 100 MG tablet TAKE 1 TABLET BY MOUTH EVERY DAY   pravastatin (PRAVACHOL) 40 MG tablet Take 40 mg by mouth daily.   rivaroxaban  (XARELTO ) 20 MG TABS tablet Take 1 tablet (20 mg total) by mouth daily with supper.   Semaglutide , 2 MG/DOSE, (OZEMPIC , 2 MG/DOSE,) 8 MG/3ML SOPN Inject 2 mg into the skin once a week.   "

## 2024-03-09 NOTE — Assessment & Plan Note (Signed)
 Normal abdominal aortic aneurysm screening ultrasound imaging.

## 2024-03-09 NOTE — Assessment & Plan Note (Signed)
Well-controlled.  Continue losartan 100 mg once daily.

## 2024-03-09 NOTE — Assessment & Plan Note (Addendum)
 Paroxysmal A-fib 2% burden on 28-day heart monitor June 2025. Longest episode 5 hours 3 minutes. Asymptomatic.  Remains in sinus rhythm today. CHA2DS2-VASc score 7.  Discussed about diagnosis of atrial fibrillation.  Risk for recurrence. Potential symptoms discussed.  Risk for stroke discussed extensively. Recommended anticoagulation for stroke prophylaxis. In this context various options Eliquis, Xarelto , warfarin reviewed.  Including side effects, potential complications such as bleeding availability of reversal agents. She prefers to proceed with Xarelto  given no strict dietary restrictions, routine checking and once a day regimen.  Start Xarelto  20 mg once daily. From cardiac standpoint no indication for aspirin  long run while she remains on Xarelto . However would recommend review with neurologist with regards to long-term need for aspirin  given her history of prior stroke like findings on imaging study.  F/u CBC in 1 month after starting xarelto .

## 2024-04-07 ENCOUNTER — Ambulatory Visit

## 2024-04-07 ENCOUNTER — Ambulatory Visit: Admitting: Family

## 2024-05-04 ENCOUNTER — Ambulatory Visit

## 2024-09-16 ENCOUNTER — Ambulatory Visit: Payer: Self-pay | Admitting: Neurology
